# Patient Record
Sex: Female | Born: 1985 | Race: Black or African American | Hispanic: No | Smoking: Current every day smoker
Health system: Southern US, Community
[De-identification: ages and names within clinical notes are randomized; demographics above are authoritative.]

## PROBLEM LIST (undated history)

## (undated) DIAGNOSIS — J45909 Unspecified asthma, uncomplicated: Secondary | ICD-10-CM

## (undated) DIAGNOSIS — I2699 Other pulmonary embolism without acute cor pulmonale: Secondary | ICD-10-CM

## (undated) HISTORY — PX: TUBAL LIGATION: SHX77

---

## 2008-02-14 ENCOUNTER — Emergency Department: Payer: Self-pay | Admitting: Emergency Medicine

## 2008-04-11 ENCOUNTER — Emergency Department: Payer: Self-pay | Admitting: Emergency Medicine

## 2008-08-10 ENCOUNTER — Emergency Department: Payer: Self-pay | Admitting: Emergency Medicine

## 2008-10-27 ENCOUNTER — Emergency Department: Payer: Self-pay | Admitting: Emergency Medicine

## 2009-01-01 ENCOUNTER — Emergency Department: Payer: Self-pay | Admitting: Emergency Medicine

## 2009-01-08 ENCOUNTER — Emergency Department: Payer: Self-pay | Admitting: Emergency Medicine

## 2009-04-12 ENCOUNTER — Emergency Department: Payer: Self-pay | Admitting: Emergency Medicine

## 2009-12-31 ENCOUNTER — Emergency Department: Payer: Self-pay | Admitting: Emergency Medicine

## 2010-01-27 ENCOUNTER — Emergency Department: Payer: Self-pay | Admitting: Emergency Medicine

## 2010-02-15 ENCOUNTER — Emergency Department: Payer: Self-pay | Admitting: Internal Medicine

## 2010-03-12 ENCOUNTER — Emergency Department: Payer: Self-pay | Admitting: Emergency Medicine

## 2010-03-13 ENCOUNTER — Emergency Department: Payer: Self-pay | Admitting: Emergency Medicine

## 2010-04-21 ENCOUNTER — Emergency Department: Payer: Self-pay | Admitting: Emergency Medicine

## 2010-06-22 ENCOUNTER — Emergency Department: Payer: Self-pay | Admitting: Unknown Physician Specialty

## 2010-08-17 ENCOUNTER — Emergency Department: Payer: Self-pay | Admitting: Emergency Medicine

## 2011-12-14 ENCOUNTER — Emergency Department: Payer: Self-pay | Admitting: Emergency Medicine

## 2011-12-16 ENCOUNTER — Emergency Department: Payer: Self-pay | Admitting: Emergency Medicine

## 2012-01-23 ENCOUNTER — Emergency Department: Payer: Self-pay | Admitting: Emergency Medicine

## 2012-11-29 ENCOUNTER — Emergency Department: Payer: Self-pay | Admitting: Emergency Medicine

## 2012-11-29 LAB — URINALYSIS, COMPLETE
Glucose,UR: NEGATIVE mg/dL (ref 0–75)
Ketone: NEGATIVE
Nitrite: NEGATIVE
Ph: 7 (ref 4.5–8.0)
Protein: NEGATIVE
RBC,UR: 330 /HPF (ref 0–5)
Squamous Epithelial: 2
Transitional Epi: <1
WBC UR: 72 /HPF (ref 0–5)

## 2013-06-04 ENCOUNTER — Emergency Department: Payer: Self-pay | Admitting: Internal Medicine

## 2014-03-26 ENCOUNTER — Emergency Department: Payer: Self-pay | Admitting: Emergency Medicine

## 2014-04-20 ENCOUNTER — Emergency Department: Payer: Self-pay | Admitting: Emergency Medicine

## 2014-05-31 ENCOUNTER — Emergency Department: Payer: Self-pay | Admitting: Emergency Medicine

## 2014-07-06 ENCOUNTER — Emergency Department: Payer: Self-pay | Admitting: Internal Medicine

## 2014-07-08 ENCOUNTER — Emergency Department: Payer: Self-pay | Admitting: Emergency Medicine

## 2014-07-08 LAB — URINALYSIS, COMPLETE
BACTERIA: NONE SEEN
Blood: NEGATIVE
Glucose,UR: NEGATIVE mg/dL (ref 0–75)
Hyaline Cast: 3
KETONE: NEGATIVE
LEUKOCYTE ESTERASE: NEGATIVE
Nitrite: NEGATIVE
Ph: 7 (ref 4.5–8.0)
Protein: NEGATIVE
Specific Gravity: 1.015 (ref 1.003–1.030)
Squamous Epithelial: 2

## 2014-07-08 LAB — BASIC METABOLIC PANEL
Anion Gap: 8 (ref 7–16)
BUN: 8 mg/dL (ref 7–18)
CHLORIDE: 110 mmol/L — AB (ref 98–107)
CREATININE: 0.97 mg/dL (ref 0.60–1.30)
Calcium, Total: 8.5 mg/dL (ref 8.5–10.1)
Co2: 24 mmol/L (ref 21–32)
EGFR (African American): >60
GLUCOSE: 104 mg/dL — AB (ref 65–99)
OSMOLALITY: 282 (ref 275–301)
Potassium: 3.5 mmol/L (ref 3.5–5.1)
Sodium: 142 mmol/L (ref 136–145)

## 2014-07-08 LAB — CBC
HCT: 43.9 % (ref 35.0–47.0)
HGB: 14.6 g/dL (ref 12.0–16.0)
MCH: 32.9 pg (ref 26.0–34.0)
MCHC: 33.3 g/dL (ref 32.0–36.0)
MCV: 99 fL (ref 80–100)
Platelet: 127 10*3/uL — ABNORMAL LOW (ref 150–440)
RBC: 4.44 10*6/uL (ref 3.80–5.20)
RDW: 13.9 % (ref 11.5–14.5)
WBC: 9.5 10*3/uL (ref 3.6–11.0)

## 2014-07-15 LAB — BODY FLUID CULTURE

## 2015-02-24 ENCOUNTER — Emergency Department: Admit: 2015-02-24 | Disposition: A | Discharge status: Home / Self Care | Payer: Self-pay | Admitting: Internal Medicine

## 2015-05-08 ENCOUNTER — Emergency Department
Admission: EM | Admit: 2015-05-08 | Discharge: 2015-05-08 | Disposition: A | Discharge status: Home / Self Care | Payer: Managed Care, Other (non HMO) | Attending: Emergency Medicine | Admitting: Emergency Medicine

## 2015-05-08 ENCOUNTER — Encounter: Payer: Self-pay | Admitting: Emergency Medicine

## 2015-05-08 ENCOUNTER — Emergency Department: Payer: Managed Care, Other (non HMO)

## 2015-05-08 DIAGNOSIS — S5012XA Contusion of left forearm, initial encounter: Secondary | ICD-10-CM | POA: Diagnosis not present

## 2015-05-08 DIAGNOSIS — Y9289 Other specified places as the place of occurrence of the external cause: Secondary | ICD-10-CM | POA: Insufficient documentation

## 2015-05-08 DIAGNOSIS — S4992XA Unspecified injury of left shoulder and upper arm, initial encounter: Secondary | ICD-10-CM | POA: Diagnosis present

## 2015-05-08 DIAGNOSIS — Y9301 Activity, walking, marching and hiking: Secondary | ICD-10-CM | POA: Insufficient documentation

## 2015-05-08 DIAGNOSIS — Z720 Tobacco use: Secondary | ICD-10-CM | POA: Insufficient documentation

## 2015-05-08 DIAGNOSIS — Y998 Other external cause status: Secondary | ICD-10-CM | POA: Diagnosis not present
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
## 2015-05-08 NOTE — Discharge Instructions (Signed)
Apply ice. Take over-the-counter Tylenol or ibuprofen as needed for pain.  Follow-up with orthopedic as needed for continued pain.  Return to the ER for new or worsening concerns.  Contusion A contusion is a deep bruise. Contusions happen when an injury causes bleeding under the skin. Signs of bruising include pain, puffiness (swelling), and discolored skin. The contusion may turn blue, purple, or yellow. HOME CARE   Put ice on the injured area.  Put ice in a plastic bag.  Place a towel between your skin and the bag.  Leave the ice on for 15-20 minutes, 03-04 times a day.  Only take medicine as told by your doctor.  Rest the injured area.  If possible, raise (elevate) the injured area to lessen puffiness. GET HELP RIGHT AWAY IF:   You have more bruising or puffiness.  You have pain that is getting worse.  Your puffiness or pain is not helped by medicine. MAKE SURE YOU:   Understand these instructions.  Will watch your condition.  Will get help right away if you are not doing well or get worse. Document Released: 04/13/2008 Document Revised: 01/18/2012 Document Reviewed: 08/31/2011 [facility] Patient Information 2015 [employer] [place]. This information is not intended to replace advice given to you by your health care provider. Make sure you discuss any questions you have with your health care provider.

## 2015-05-09 ENCOUNTER — Emergency Department: Payer: Managed Care, Other (non HMO)

## 2015-05-09 ENCOUNTER — Emergency Department
Admission: EM | Admit: 2015-05-09 | Discharge: 2015-05-09 | Disposition: A | Discharge status: Home / Self Care | Payer: Managed Care, Other (non HMO) | Attending: Emergency Medicine | Admitting: Emergency Medicine

## 2015-05-09 DIAGNOSIS — Y9301 Activity, walking, marching and hiking: Secondary | ICD-10-CM | POA: Diagnosis not present

## 2015-05-09 DIAGNOSIS — Y92410 Unspecified street and highway as the place of occurrence of the external cause: Secondary | ICD-10-CM | POA: Diagnosis not present

## 2015-05-09 DIAGNOSIS — Z720 Tobacco use: Secondary | ICD-10-CM | POA: Diagnosis not present

## 2015-05-09 DIAGNOSIS — S46912A Strain of unspecified muscle, fascia and tendon at shoulder and upper arm level, left arm, initial encounter: Secondary | ICD-10-CM | POA: Insufficient documentation

## 2015-05-09 DIAGNOSIS — S4992XA Unspecified injury of left shoulder and upper arm, initial encounter: Secondary | ICD-10-CM | POA: Diagnosis present

## 2015-05-09 DIAGNOSIS — Y998 Other external cause status: Secondary | ICD-10-CM | POA: Diagnosis not present

## 2015-05-09 HISTORY — DX: Unspecified asthma, uncomplicated: J45.909

## 2015-05-09 MED ORDER — NAPROXEN 500 MG PO TABS: 500.0000 mg | ORAL_TABLET | Freq: Two times a day (BID) | ORAL | Status: DC
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
## 2015-05-09 NOTE — ED Notes (Signed)
Pt informed to return if any life threatening symptoms occur.  
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
## 2015-08-13 ENCOUNTER — Emergency Department
Admission: EM | Admit: 2015-08-13 | Discharge: 2015-08-13 | Disposition: A | Discharge status: Home / Self Care | Payer: Managed Care, Other (non HMO) | Attending: Emergency Medicine | Admitting: Emergency Medicine

## 2015-08-13 ENCOUNTER — Ambulatory Visit (HOSPITAL_COMMUNITY)
Admission: EM | Admit: 2015-08-13 | Discharge: 2015-08-13 | Disposition: A | Discharge status: Home / Self Care | Payer: No Typology Code available for payment source | Source: Ambulatory Visit | Attending: Emergency Medicine | Admitting: Emergency Medicine

## 2015-08-13 DIAGNOSIS — Y9389 Activity, other specified: Secondary | ICD-10-CM | POA: Diagnosis not present

## 2015-08-13 DIAGNOSIS — F419 Anxiety disorder, unspecified: Secondary | ICD-10-CM | POA: Insufficient documentation

## 2015-08-13 DIAGNOSIS — T7421XA Adult sexual abuse, confirmed, initial encounter: Secondary | ICD-10-CM | POA: Insufficient documentation

## 2015-08-13 DIAGNOSIS — Z720 Tobacco use: Secondary | ICD-10-CM | POA: Diagnosis not present

## 2015-08-13 DIAGNOSIS — B9689 Other specified bacterial agents as the cause of diseases classified elsewhere: Secondary | ICD-10-CM

## 2015-08-13 DIAGNOSIS — Z791 Long term (current) use of non-steroidal anti-inflammatories (NSAID): Secondary | ICD-10-CM | POA: Diagnosis not present

## 2015-08-13 DIAGNOSIS — Z0441 Encounter for examination and observation following alleged adult rape: Secondary | ICD-10-CM | POA: Insufficient documentation

## 2015-08-13 DIAGNOSIS — Y9289 Other specified places as the place of occurrence of the external cause: Secondary | ICD-10-CM | POA: Insufficient documentation

## 2015-08-13 DIAGNOSIS — N760 Acute vaginitis: Secondary | ICD-10-CM | POA: Diagnosis not present

## 2015-08-13 DIAGNOSIS — Z3202 Encounter for pregnancy test, result negative: Secondary | ICD-10-CM | POA: Insufficient documentation

## 2015-08-13 DIAGNOSIS — Y998 Other external cause status: Secondary | ICD-10-CM | POA: Insufficient documentation

## 2015-08-13 LAB — CBC
HCT: 46.9 % (ref 35.0–47.0)
HEMOGLOBIN: 15.9 g/dL (ref 12.0–16.0)
MCH: 33 pg (ref 26.0–34.0)
MCHC: 33.9 g/dL (ref 32.0–36.0)
MCV: 97.1 fL (ref 80.0–100.0)
Platelets: 132 10*3/uL — ABNORMAL LOW (ref 150–440)
RBC: 4.82 MIL/uL (ref 3.80–5.20)
RDW: 14.1 % (ref 11.5–14.5)
WBC: 10.2 10*3/uL (ref 3.6–11.0)

## 2015-08-13 LAB — COMPREHENSIVE METABOLIC PANEL
ALK PHOS: 86 U/L (ref 38–126)
ALT: 23 U/L (ref 14–54)
AST: 26 U/L (ref 15–41)
Albumin: 4.1 g/dL (ref 3.5–5.0)
Anion gap: 10 (ref 5–15)
BUN: 11 mg/dL (ref 6–20)
CALCIUM: 9.5 mg/dL (ref 8.9–10.3)
CO2: 24 mmol/L (ref 22–32)
CREATININE: 0.85 mg/dL (ref 0.44–1.00)
Chloride: 108 mmol/L (ref 101–111)
GFR calc non Af Amer: >60 mL/min (ref >60)
GLUCOSE: 129 mg/dL — AB (ref 65–99)
Potassium: 4.2 mmol/L (ref 3.5–5.1)
SODIUM: 142 mmol/L (ref 135–145)
Total Bilirubin: 0.7 mg/dL (ref 0.3–1.2)
Total Protein: 7.4 g/dL (ref 6.5–8.1)

## 2015-08-13 LAB — POCT PREGNANCY, URINE: Preg Test, Ur: NEGATIVE

## 2015-08-13 LAB — RAPID HIV SCREEN (HIV 1/2 AB+AG)
HIV 1/2 Antibodies: NONREACTIVE
HIV-1 P24 Antigen - HIV24: NONREACTIVE

## 2015-08-13 MED ORDER — PROMETHAZINE HCL 25 MG PO TABS
25.0000 mg | ORAL_TABLET | Freq: Four times a day (QID) | ORAL | Status: DC | PRN
  Administered 2015-08-13: 12.5 mg via ORAL

## 2015-08-13 MED ORDER — AZITHROMYCIN 250 MG PO TABS
ORAL_TABLET | ORAL | Status: AC
  Administered 2015-08-13: 1000 mg via ORAL
  Filled 2015-08-13: qty 4

## 2015-08-13 MED ORDER — ULIPRISTAL ACETATE 30 MG PO TABS
ORAL_TABLET | ORAL | Status: AC
  Filled 2015-08-13: qty 1

## 2015-08-13 MED ORDER — METRONIDAZOLE 500 MG PO TABS
2000.0000 mg | ORAL_TABLET | Freq: Once | ORAL | Status: AC
  Administered 2015-08-13: 2000 mg via ORAL

## 2015-08-13 MED ORDER — ALIGN PO CAPS: 1.0000 | ORAL_CAPSULE | Freq: Every day | ORAL | Status: DC

## 2015-08-13 MED ORDER — DIVALPROEX SODIUM 500 MG PO DR TAB
DELAYED_RELEASE_TABLET | ORAL | Status: AC
  Administered 2015-08-13: 1000 mg via ORAL
  Filled 2015-08-13: qty 2

## 2015-08-13 MED ORDER — AZITHROMYCIN 1 G PO PACK: 1.0000 g | PACK | Freq: Once | ORAL | Status: DC

## 2015-08-13 MED ORDER — CEFIXIME 400 MG PO TABS
400.0000 mg | ORAL_TABLET | Freq: Once | ORAL | Status: AC
  Administered 2015-08-13: 400 mg via ORAL

## 2015-08-13 MED ORDER — DIVALPROEX SODIUM 500 MG PO DR TAB
1000.0000 mg | DELAYED_RELEASE_TABLET | Freq: Once | ORAL | Status: AC
  Administered 2015-08-13: 1000 mg via ORAL

## 2015-08-13 MED ORDER — FLUCONAZOLE 150 MG PO TABS: 150.0000 mg | ORAL_TABLET | Freq: Every day | ORAL | Status: DC

## 2015-08-13 MED ORDER — ULIPRISTAL ACETATE 30 MG PO TABS: 30.0000 mg | ORAL_TABLET | Freq: Once | ORAL | Status: DC

## 2015-08-13 MED ORDER — EMTRICITABINE-TENOFOVIR DF 200-300 MG PO TABS: 1.0000 | ORAL_TABLET | Freq: Every day | ORAL | Status: DC

## 2015-08-13 MED ORDER — RALTEGRAVIR POTASSIUM 400 MG PO TABS: 400.0000 mg | ORAL_TABLET | Freq: Two times a day (BID) | ORAL | Status: DC

## 2015-08-13 MED ORDER — CEFIXIME 400 MG PO CAPS
ORAL_CAPSULE | ORAL | Status: AC
  Administered 2015-08-13: 400 mg via ORAL
  Filled 2015-08-13: qty 1

## 2015-08-13 NOTE — ED Notes (Signed)
SANE rn states that discharge instructions were given to pt and pt verbalized understanding.

## 2015-08-13 NOTE — Care Management Note (Signed)
Case Management Note  Patient Details  Name: Flor Whitacre MRN: [id number] Date of Birth: 20-Aug-1986  Subjective/Objective:         Updated by pharmacy, and the remaining medication will be here tomorrow by 12noon. I have asked the pt. To return tomorrow afternoon to pick it up, and she says she understands, Discharge planning done by SANE staff.          Action/Plan:   Expected Discharge Date:                  Expected Discharge Plan:     In-House Referral:     Discharge planning Services     Post Acute Care Choice:    Choice offered to:     DME Arranged:    DME Agency:     HH Arranged:    HH Agency:     Status of Service:     Medicare Important Message Given:    Date Medicare IM Given:    Medicare IM give by:    Date Additional Medicare IM Given:    Additional Medicare Important Message give by:     If discussed at Long Length of Stay Meetings, dates discussed:    Additional Comments:  Berna Bue, RN 08/13/2015, 12:33 PM

## 2015-08-13 NOTE — ED Notes (Signed)
SANE nurse in with patient.
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
## 2015-08-13 NOTE — ED Notes (Signed)
Pt sitting in room, denies complaints at this time.
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
## 2015-08-13 NOTE — ED Notes (Signed)
Meds from pyxis not available in SANE pkt, meds pulled individually. Given to SANE RN.
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
## 2015-08-13 NOTE — Care Management Note (Signed)
Case Management Note  Patient Details  Name: Joy Ramos MRN: [id number] Date of Birth: [birth year]  Subjective/Objective:   Spoke to pharmacy ,and they now say they only have 30days  Of one medication and 7 days of the other. They will issue the meds they have in stock, and the pt. Will need to return to get the balance in 2 days time. This information has been shared with Weber Cooks the SANE  Nurse, as well as Wylie Hail, the RN for the pt.                 Action/Plan:   Expected Discharge Date:                  Expected Discharge Plan:     In-House Referral:     Discharge planning Services     Post Acute Care Choice:    Choice offered to:     DME Arranged:    DME Agency:     HH Arranged:    HH Agency:     Status of Service:     Medicare Important Message Given:    Date Medicare IM Given:    Medicare IM give by:    Date Additional Medicare IM Given:    Additional Medicare Important Message give by:     If discussed at Long Length of Stay Meetings, dates discussed:    Additional Comments:  Berna Bue, RN 08/13/2015, 12:22 PM

## 2015-08-13 NOTE — Care Management Note (Signed)
Case Management Note  Patient Details  Name: Joy Ramos MRN: [id number] Date of Birth: [birth year]  Subjective/Objective:    Meds requested from pharmacy per policy for HIV PEP. Verified quantity needed is available. Medical necessity letter completed by Dr Cyril Loosen and sent with scripts to pharmacy.                Action/Plan:   Expected Discharge Date:                  Expected Discharge Plan:     In-House Referral:     Discharge planning Services     Post Acute Care Choice:    Choice offered to:     DME Arranged:    DME Agency:     HH Arranged:    HH Agency:     Status of Service:     Medicare Important Message Given:    Date Medicare IM Given:    Medicare IM give by:    Date Additional Medicare IM Given:    Additional Medicare Important Message give by:     If discussed at Long Length of Stay Meetings, dates discussed:    Additional Comments:  Berna Bue, RN 08/13/2015, 10:18 AM

## 2015-08-14 DIAGNOSIS — Z0441 Encounter for examination and observation following alleged adult rape: Secondary | ICD-10-CM | POA: Diagnosis not present

## 2015-08-14 NOTE — Care Management Note (Signed)
Case Management Note  Patient Details  Name: Joy Ramos MRN: [id number] Date of Birth: 09-Oct-1986  Subjective/Objective:  Called patient at number provided to let her know the remaining medication has arrived and is ready for pick up. She has stated she will be here soon to do same.                  Action/Plan:   Expected Discharge Date:                  Expected Discharge Plan:     In-House Referral:     Discharge planning Services     Post Acute Care Choice:    Choice offered to:     DME Arranged:    DME Agency:     HH Arranged:    HH Agency:     Status of Service:     Medicare Important Message Given:    Date Medicare IM Given:    Medicare IM give by:    Date Additional Medicare IM Given:    Additional Medicare Important Message give by:     If discussed at Long Length of Stay Meetings, dates discussed:    Additional Comments:  Berna Bue, RN 08/14/2015, 10:52 AM

## 2016-01-04 ENCOUNTER — Encounter: Payer: Self-pay | Admitting: Emergency Medicine

## 2016-01-04 ENCOUNTER — Emergency Department
Admission: EM | Admit: 2016-01-04 | Discharge: 2016-01-04 | Disposition: A | Discharge status: Home / Self Care | Payer: Managed Care, Other (non HMO) | Attending: Student | Admitting: Student

## 2016-01-04 DIAGNOSIS — Z79899 Other long term (current) drug therapy: Secondary | ICD-10-CM | POA: Insufficient documentation

## 2016-01-04 DIAGNOSIS — K644 Residual hemorrhoidal skin tags: Secondary | ICD-10-CM | POA: Diagnosis not present

## 2016-01-04 DIAGNOSIS — F17210 Nicotine dependence, cigarettes, uncomplicated: Secondary | ICD-10-CM | POA: Diagnosis not present

## 2016-01-04 DIAGNOSIS — Z792 Long term (current) use of antibiotics: Secondary | ICD-10-CM | POA: Diagnosis not present

## 2016-01-04 DIAGNOSIS — Z791 Long term (current) use of non-steroidal anti-inflammatories (NSAID): Secondary | ICD-10-CM | POA: Diagnosis not present

## 2016-01-04 DIAGNOSIS — K649 Unspecified hemorrhoids: Secondary | ICD-10-CM | POA: Diagnosis present

## 2016-01-04 MED ORDER — HYDROCODONE-ACETAMINOPHEN 5-325 MG PO TABS: 1.0000 | ORAL_TABLET | ORAL | Status: DC | PRN

## 2016-01-04 MED ORDER — HYDROCORTISONE 2.5 % RE CREA: TOPICAL_CREAM | RECTAL | Status: AC

## 2016-01-04 MED ORDER — ZINC OXIDE 16 % EX OINT: 1.0000 "application " | TOPICAL_OINTMENT | CUTANEOUS | Status: DC | PRN

## 2016-01-04 NOTE — ED Provider Notes (Signed)
[facility] Emergency Department Provider Note  ____________________________________________  Time seen: Approximately 7:46 AM  I have reviewed the triage vital signs and the nursing notes.   HISTORY  Chief Complaint Hemorrhoids    HPI Joy Ramos is a 30 y.o. female who presents for evaluation of hemorrhoids. Patient has a past medical history significant for the same and desires referral to a surgeon for evaluation and removal. Patient states that she has tried suppositories and cream over-the-counter and no relief. Denies any bleeding. Denies any recent trauma to the rectal area. When her stools or numbness heart seems to make her symptoms will get better so she started taking some stool softeners. Worsened with straining. Describes pain as moderately 5/10 at this time. Has not noticed any blood when wiping   Past Medical History  Diagnosis Date  . Asthma     There are no active problems to display for this patient.   Past Surgical History  Procedure Laterality Date  . Cesarean section      x3  . Tubal ligation      Current Outpatient Rx  Name  Route  Sig  Dispense  Refill  . bifidobacterium infantis (ALIGN) capsule   Oral   Take 1 capsule by mouth daily.   90 capsule   0   . emtricitabine-tenofovir (TRUVADA) 200-300 MG tablet   Oral   Take 1 tablet by mouth daily.   30 tablet   0   . fluconazole (DIFLUCAN) 150 MG tablet   Oral   Take 1 tablet (150 mg total) by mouth daily.   2 tablet   0     Take 1 tab today and then the next in 72 hours   . HYDROcodone-acetaminophen (NORCO) 5-325 MG tablet   Oral   Take 1-2 tablets by mouth every 4 (four) hours as needed for moderate pain.   15 tablet   0   . hydrocortisone (ANUSOL-HC) 2.5 % rectal cream      Apply rectally 2 times daily   30 g   1   . naproxen (NAPROSYN) 500 MG tablet   Oral   Take 1 tablet (500 mg total) by mouth 2 (two) times daily with a meal.   30 tablet   0   . raltegravir (ISENTRESS) 400 MG tablet   Oral   Take 1 tablet (400 mg total) by mouth 2 (two) times daily.   60 tablet   0   . Zinc Oxide 16 % OINT   Apply externally   Apply 1 application topically as needed.   1 Tube   2     Allergies Morphine and related  No family history on file.  Social History Social History  Substance Use Topics  . Smoking status: Current Every Day Smoker -- 1.50 packs/day    Types: Cigarettes  . Smokeless tobacco: None  . Alcohol Use: No    Review of Systems Constitutional: No fever/chills Cardiovascular: Denies chest pain. Respiratory: Denies shortness of breath. Gastrointestinal: No abdominal pain.  No nausea, no vomiting.  No diarrhea.  No constipation. Positive for hemorrhoids Genitourinary: Negative for dysuria. Musculoskeletal: Negative for back pain. Skin: Negative for rash. Neurological: Negative for headaches, focal weakness or numbness.  10-point ROS otherwise negative.  ____________________________________________   PHYSICAL EXAM:  VITAL SIGNS: ED Triage Vitals  Enc Vitals Group     BP 01/04/16 0723 108/66 mmHg     Pulse Rate 01/04/16 0723 103     Resp 01/04/16  0723 18     Temp 01/04/16 0723 98.4 F (36.9 C)     Temp Source 01/04/16 0723 Oral     SpO2 01/04/16 0723 100 %     Weight 01/04/16 0723 199 lb (90.266 kg)     Height 01/04/16 0723  (1.651 m)     Head Cir --      Peak Flow --      Pain Score 01/04/16 0724 5     Pain Loc --      Pain Edu? --      Excl. in GC? --     Constitutional: Alert and oriented. Well appearing and in no acute distress.  Cardiovascular: Normal rate, regular rhythm. Grossly normal heart sounds.  Good peripheral circulation. Respiratory: Normal respiratory effort.  No retractions. Lungs CTAB. Gastrointestinal: Soft and nontender. No distention. No abdominal bruits. No CVA tenderness. Rectal exam was unremarkable with a small few nonthrombosed hemorrhoids. Musculoskeletal: No  lower extremity tenderness nor edema.  No joint effusions. Neurologic:  Normal speech and language. No gross focal neurologic deficits are appreciated. No gait instability. Skin:  Skin is warm, dry and intact. No rash noted. Psychiatric: Mood and affect are normal. Speech and behavior are normal.  ____________________________________________   LABS (all labs ordered are listed, but only abnormal results are displayed)  Labs Reviewed - No data to display ____________________________________________    PROCEDURES  Procedure(s) performed: None  Critical Care performed: No  ____________________________________________   INITIAL IMPRESSION / ASSESSMENT AND PLAN / ED COURSE  Pertinent labs & imaging results that were available during my care of the patient were reviewed by me and considered in my medical decision making (see chart for details).  Mild case of external, nonthrombosed hemorrhoids. Reassurance provided Rx given for Anusol HC cream and zinc oxide ointment. Patient encouraged to follow up with the surgeon on-call number given. Patient voices understanding we'll continue her stool softeners and follow up with her PCP as scheduled. She voices no other emergency medical complaints at this time. ____________________________________________   FINAL CLINICAL IMPRESSION(S) / ED DIAGNOSES  Final diagnoses:  External hemorrhoids without complication     This chart was dictated using voice recognition software/Dragon. Despite best efforts to proofread, errors can occur which can change the meaning. Any change was purely unintentional.   Evangeline Dakin, PA-C 01/04/16 0800  Gayla Doss, MD 01/04/16 1400

## 2016-01-04 NOTE — Discharge Instructions (Signed)
Hemorrhoids °Hemorrhoids are swollen veins around the rectum or anus. There are two types of hemorrhoids:  °· Internal hemorrhoids. These occur in the veins just inside the rectum. They may poke through to the outside and become irritated and painful. °· External hemorrhoids. These occur in the veins outside the anus and can be felt as a painful swelling or hard lump near the anus. °CAUSES °· Pregnancy.   °· Obesity.   °· Constipation or diarrhea.   °· Straining to have a bowel movement.   °· Sitting for long periods on the toilet. °· Heavy lifting or other activity that caused you to strain. °· Anal intercourse. °SYMPTOMS  °· Pain.   °· Anal itching or irritation.   °· Rectal bleeding.   °· Fecal leakage.   °· Anal swelling.   °· One or more lumps around the anus.   °DIAGNOSIS  °Your caregiver may be able to diagnose hemorrhoids by visual examination. Other examinations or tests that may be performed include:  °· Examination of the rectal area with a gloved hand (digital rectal exam).   °· Examination of anal canal using a small tube (scope).   °· A blood test if you have lost a significant amount of blood. °· A test to look inside the colon (sigmoidoscopy or colonoscopy). °TREATMENT °Most hemorrhoids can be treated at home. However, if symptoms do not seem to be getting better or if you have a lot of rectal bleeding, your caregiver may perform a procedure to help make the hemorrhoids get smaller or remove them completely. Possible treatments include:  °· Placing a rubber band at the base of the hemorrhoid to cut off the circulation (rubber band ligation).   °· Injecting a chemical to shrink the hemorrhoid (sclerotherapy).   °· Using a tool to burn the hemorrhoid (infrared light therapy).   °· Surgically removing the hemorrhoid (hemorrhoidectomy).   °· Stapling the hemorrhoid to block blood flow to the tissue (hemorrhoid stapling).   °HOME CARE INSTRUCTIONS  °· Eat foods with fiber, such as whole grains, beans,  nuts, fruits, and vegetables. Ask your doctor about taking products with added fiber in them (fiber supplements). °· Increase fluid intake. Drink enough water and fluids to keep your urine clear or pale yellow.   °· Exercise regularly.   °· Go to the bathroom when you have the urge to have a bowel movement. Do not wait.   °· Avoid straining to have bowel movements.   °· Keep the anal area dry and clean. Use wet toilet paper or moist towelettes after a bowel movement.   °· Medicated creams and suppositories may be used or applied as directed.   °· Only take over-the-counter or prescription medicines as directed by your caregiver.   °· Take warm sitz baths for 15-20 minutes, 3-4 times a day to ease pain and discomfort.   °· Place ice packs on the hemorrhoids if they are tender and swollen. Using ice packs between sitz baths may be helpful.   °· Put ice in a plastic bag.   °· Place a towel between your skin and the bag.   °· Leave the ice on for 15-20 minutes, 3-4 times a day.   °· Do not use a donut-shaped pillow or sit on the toilet for long periods. This increases blood pooling and pain.   °SEEK MEDICAL CARE IF: °· You have increasing pain and swelling that is not controlled by treatment or medicine. °· You have uncontrolled bleeding. °· You have difficulty or you are unable to have a bowel movement. °· You have pain or inflammation outside the area of the hemorrhoids. °MAKE SURE YOU: °· Understand these instructions. °·   Will watch your condition.  Will get help right away if you are not doing well or get worse.   This information is not intended to replace advice given to you by your health care provider. Make sure you discuss any questions you have with your health care provider.   Document Released: 10/23/2000 Document Revised: 10/12/2012 Document Reviewed: 08/30/2012 Elsevier Interactive Patient Education 2016 [employer] Inc[employer]  Nonsurgical Procedures for Hemorrhoids Nonsurgical procedures can be used to  treat hemorrhoids. Hemorrhoids are swollen veins that are inside the rectum (internal hemorrhoids) or around the anus (external hemorrhoids). They are caused by increased pressure in the anal area. This pressure may result from straining to have a bowel movement (constipation), diarrhea, pregnancy, obesity, anal sex, or sitting for long periods of time. Hemorrhoids can cause symptoms such as pain and bleeding. Various procedures may be performed if diet changes, lifestyle changes, and other treatments do not help your symptoms. Some of these procedures do not involve surgery. Three common nonsurgical procedures are:  Rubber band ligation. Rubber bands are used to cut off the blood supply to the hemorrhoids.  Sclerotherapy. Medicine is injected into the hemorrhoids to shrink them.  Infrared coagulation. A type of light energy is used to get rid of the hemorrhoids. LET [facility] LLCYOUR HEALTH CARE PROVIDER KNOW ABOUT:  Any allergies you have.  All medicines you are taking, including vitamins, herbs, eye drops, creams, and over-the-counter medicines.  Previous problems you or members of your family have had with the use of anesthetics.  Any blood disorders you have.  Previous surgeries you have had.  Any medical conditions you have.  Whether you are pregnant or may be pregnant. RISKS AND COMPLICATIONS Generally, this is a safe procedure. However, problems may occur, including:  Infection.  Bleeding.  Pain. BEFORE THE PROCEDURE  Ask your health care provider about:  Changing or stopping your regular medicines. This is especially important if you are taking diabetes medicines or blood thinners.  Taking medicines such as aspirin and ibuprofen. These medicines can thin your blood. Do not take these medicines before your procedure if your health care provider instructs you not to.  You may need to have a procedure to examine the inside of your colon with a scope (colonoscopy). Your health care  provider may do this to make sure that there are no other causes for your bleeding or pain. PROCEDURE  Your health care provider will clean your rectal area with a rinsing solution.  A lubricating jelly may be placed into your rectum. The jelly may contain a medicine to numb the area (local anesthetic).  Your health care provider will insert a short scope (anoscope) into your rectum to examine the hemorrhoids.  One of the following techniques will be used. Rubber Band Ligation Your health care provider will place medical instruments through the scope to put rubber bands around the base of your hemorrhoids. The bands will cut off the blood supply to the hemorrhoids. The hemorrhoids will fall off after several days. Sclerotherapy Your health care provider will inject medicine through the scope into your hemorrhoids. This will cause them to shrink and dry up. Infrared Coagulation Your health care provider will shine a type of light through the scope onto your hemorrhoids. This light will generate energy (infrared radiation). It will cause the hemorrhoids to scar and then fall off. Each of these procedures may vary among health care providers and hospitals. AFTER THE PROCEDURE  You will be monitored to make sure that you  have no bleeding.  Return to your normal activities as told by your health care provider.   This information is not intended to replace advice given to you by your health care provider. Make sure you discuss any questions you have with your health care provider.   Document Released: 08/23/2009 Document Revised: 07/17/2015 Document Reviewed: 01/21/2015 Elsevier Interactive Patient Education [employer]2016 Else[employer] Inc.

## 2016-01-14 ENCOUNTER — Encounter: Payer: Self-pay | Admitting: Medical Oncology

## 2016-01-14 ENCOUNTER — Emergency Department
Admission: EM | Admit: 2016-01-14 | Discharge: 2016-01-14 | Disposition: A | Discharge status: Home / Self Care | Payer: Managed Care, Other (non HMO) | Attending: Emergency Medicine | Admitting: Emergency Medicine

## 2016-01-14 ENCOUNTER — Emergency Department: Payer: Managed Care, Other (non HMO)

## 2016-01-14 DIAGNOSIS — Y9389 Activity, other specified: Secondary | ICD-10-CM | POA: Insufficient documentation

## 2016-01-14 DIAGNOSIS — Y998 Other external cause status: Secondary | ICD-10-CM | POA: Diagnosis not present

## 2016-01-14 DIAGNOSIS — S29002A Unspecified injury of muscle and tendon of back wall of thorax, initial encounter: Secondary | ICD-10-CM | POA: Insufficient documentation

## 2016-01-14 DIAGNOSIS — Y92410 Unspecified street and highway as the place of occurrence of the external cause: Secondary | ICD-10-CM | POA: Insufficient documentation

## 2016-01-14 DIAGNOSIS — Z792 Long term (current) use of antibiotics: Secondary | ICD-10-CM | POA: Diagnosis not present

## 2016-01-14 DIAGNOSIS — Z79899 Other long term (current) drug therapy: Secondary | ICD-10-CM | POA: Diagnosis not present

## 2016-01-14 DIAGNOSIS — F17210 Nicotine dependence, cigarettes, uncomplicated: Secondary | ICD-10-CM | POA: Insufficient documentation

## 2016-01-14 DIAGNOSIS — S199XXA Unspecified injury of neck, initial encounter: Secondary | ICD-10-CM | POA: Diagnosis present

## 2016-01-14 DIAGNOSIS — Z791 Long term (current) use of non-steroidal anti-inflammatories (NSAID): Secondary | ICD-10-CM | POA: Insufficient documentation

## 2016-01-14 DIAGNOSIS — M62838 Other muscle spasm: Secondary | ICD-10-CM | POA: Insufficient documentation

## 2016-01-14 DIAGNOSIS — Z7952 Long term (current) use of systemic steroids: Secondary | ICD-10-CM | POA: Diagnosis not present

## 2016-01-14 DIAGNOSIS — S161XXA Strain of muscle, fascia and tendon at neck level, initial encounter: Secondary | ICD-10-CM | POA: Diagnosis not present

## 2016-01-14 MED ORDER — NAPROXEN 500 MG PO TABS: 500.0000 mg | ORAL_TABLET | Freq: Two times a day (BID) | ORAL | Status: DC

## 2016-01-14 MED ORDER — CYCLOBENZAPRINE HCL 10 MG PO TABS: 10.0000 mg | ORAL_TABLET | Freq: Three times a day (TID) | ORAL | Status: DC | PRN
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
## 2016-01-14 NOTE — Discharge Instructions (Signed)
Cervical Strain and Sprain With Rehab Cervical strain and sprain are injuries that commonly occur with "whiplash" injuries. Whiplash occurs when the neck is forcefully whipped backward or forward, such as during a motor vehicle accident or during contact sports. The muscles, ligaments, tendons, discs, and nerves of the neck are susceptible to injury when this occurs. RISK FACTORS Risk of having a whiplash injury increases if:  Osteoarthritis of the spine.  Situations that make head or neck accidents or trauma more likely.  High-risk sports (football, rugby, wrestling, hockey, auto racing, gymnastics, diving, contact karate, or boxing).  Poor strength and flexibility of the neck.  Previous neck injury.  Poor tackling technique.  Improperly fitted or padded equipment. SYMPTOMS   Pain or stiffness in the front or back of neck or both.  Symptoms may present immediately or up to 24 hours after injury.  Dizziness, headache, nausea, and vomiting.  Muscle spasm with soreness and stiffness in the neck.  Tenderness and swelling at the injury site. PREVENTION  Learn and use proper technique (avoid tackling with the head, spearing, and head-butting; use proper falling techniques to avoid landing on the head).  Warm up and stretch properly before activity.  Maintain physical fitness:  Strength, flexibility, and endurance.  Cardiovascular fitness.  Wear properly fitted and padded protective equipment, such as padded soft collars, for participation in contact sports. PROGNOSIS  Recovery from cervical strain and sprain injuries is dependent on the extent of the injury. These injuries are usually curable in 1 week to 3 months with appropriate treatment.  RELATED COMPLICATIONS   Temporary numbness and weakness may occur if the nerve roots are damaged, and this may persist until the nerve has completely healed.  Chronic pain due to frequent recurrence of symptoms.  Prolonged healing,  especially if activity is resumed too soon (before complete recovery). TREATMENT  Treatment initially involves the use of ice and medication to help reduce pain and inflammation. It is also important to perform strengthening and stretching exercises and modify activities that worsen symptoms so the injury does not get worse. These exercises may be performed at home or with a therapist. For patients who experience severe symptoms, a soft, padded collar may be recommended to be worn around the neck.  Improving your posture may help reduce symptoms. Posture improvement includes pulling your chin and abdomen in while sitting or standing. If you are sitting, sit in a firm chair with your buttocks against the back of the chair. While sleeping, try replacing your pillow with a small towel rolled to 2 inches in diameter, or use a cervical pillow or soft cervical collar. Poor sleeping positions delay healing.  For patients with nerve root damage, which causes numbness or weakness, the use of a cervical traction apparatus may be recommended. Surgery is rarely necessary for these injuries. However, cervical strain and sprains that are present at birth (congenital) may require surgery. MEDICATION   If pain medication is necessary, nonsteroidal anti-inflammatory medications, such as aspirin and ibuprofen, or other minor pain relievers, such as acetaminophen, are often recommended.  Do not take pain medication for 7 days before surgery.  Prescription pain relievers may be given if deemed necessary by your caregiver. Use only as directed and only as much as you need. HEAT AND COLD:   Cold treatment (icing) relieves pain and reduces inflammation. Cold treatment should be applied for 10 to 15 minutes every 2 to 3 hours for inflammation and pain and immediately after any activity that aggravates your  HEAT AND COLD:   · Cold treatment (icing) relieves pain and reduces inflammation. Cold treatment should be applied for 10 to 15 minutes every 2 to 3 hours for inflammation and pain and immediately after any activity that aggravates your symptoms. Use ice packs or an ice massage.  · Heat treatment may be used prior to performing the stretching and  strengthening activities prescribed by your caregiver, physical therapist, or athletic trainer. Use a heat pack or a warm soak.  SEEK MEDICAL CARE IF:   · Symptoms get worse or do not improve in 2 weeks despite treatment.  · New, unexplained symptoms develop (drugs used in treatment may produce side effects).  EXERCISES  RANGE OF MOTION (ROM) AND STRETCHING EXERCISES - Cervical Strain and Sprain  These exercises may help you when beginning to rehabilitate your injury. In order to successfully resolve your symptoms, you must improve your posture. These exercises are designed to help reduce the forward-head and rounded-shoulder posture which contributes to this condition. Your symptoms may resolve with or without further involvement from your physician, physical therapist or athletic trainer. While completing these exercises, remember:   · Restoring tissue flexibility helps normal motion to return to the joints. This allows healthier, less painful movement and activity.  · An effective stretch should be held for at least 20 seconds, although you may need to begin with shorter hold times for comfort.  · A stretch should never be painful. You should only feel a gentle lengthening or release in the stretched tissue.  STRETCH- Axial Extensors  · Lie on your back on the floor. You may bend your knees for comfort. Place a rolled-up hand towel or dish towel, about 2 inches in diameter, under the part of your head that makes contact with the floor.  · Gently tuck your chin, as if trying to make a "double chin," until you feel a gentle stretch at the base of your head.  · Hold __________ seconds.  Repeat __________ times. Complete this exercise __________ times per day.   STRETCH - Axial Extension   · Stand or sit on a firm surface. Assume a good posture: chest up, shoulders drawn back, abdominal muscles slightly tense, knees unlocked (if standing) and feet hip width apart.  · Slowly retract your chin so your head slides back  and your chin slightly lowers. Continue to look straight ahead.  · You should feel a gentle stretch in the back of your head. Be certain not to feel an aggressive stretch since this can cause headaches later.  · Hold for __________ seconds.  Repeat __________ times. Complete this exercise __________ times per day.  STRETCH - Cervical Side Bend   · Stand or sit on a firm surface. Assume a good posture: chest up, shoulders drawn back, abdominal muscles slightly tense, knees unlocked (if standing) and feet hip width apart.  · Without letting your nose or shoulders move, slowly tip your right / left ear to your shoulder until your feel a gentle stretch in the muscles on the opposite side of your neck.  · Hold __________ seconds.  Repeat __________ times. Complete this exercise __________ times per day.  STRETCH - Cervical Rotators   · Stand or sit on a firm surface. Assume a good posture: chest up, shoulders drawn back, abdominal muscles slightly tense, knees unlocked (if standing) and feet hip width apart.  · Keeping your eyes level with the ground, slowly turn your head until you feel a gentle stretch along   the back and opposite side of your neck.  · Hold __________ seconds.  Repeat __________ times. Complete this exercise __________ times per day.  RANGE OF MOTION - Neck Circles   · Stand or sit on a firm surface. Assume a good posture: chest up, shoulders drawn back, abdominal muscles slightly tense, knees unlocked (if standing) and feet hip width apart.  · Gently roll your head down and around from the back of one shoulder to the back of the other. The motion should never be forced or painful.  · Repeat the motion 10-20 times, or until you feel the neck muscles relax and loosen.  Repeat __________ times. Complete the exercise __________ times per day.  STRENGTHENING EXERCISES - Cervical Strain and Sprain  These exercises may help you when beginning to rehabilitate your injury. They may resolve your symptoms with or  without further involvement from your physician, physical therapist, or athletic trainer. While completing these exercises, remember:   · Muscles can gain both the endurance and the strength needed for everyday activities through controlled exercises.  · Complete these exercises as instructed by your physician, physical therapist, or athletic trainer. Progress the resistance and repetitions only as guided.  · You may experience muscle soreness or fatigue, but the pain or discomfort you are trying to eliminate should never worsen during these exercises. If this pain does worsen, stop and make certain you are following the directions exactly. If the pain is still present after adjustments, discontinue the exercise until you can discuss the trouble with your clinician.  STRENGTH - Cervical Flexors, Isometric  · Face a wall, standing about 6 inches away. Place a small pillow, a ball about 6-8 inches in diameter, or a folded towel between your forehead and the wall.  · Slightly tuck your chin and gently push your forehead into the soft object. Push only with mild to moderate intensity, building up tension gradually. Keep your jaw and forehead relaxed.  · Hold 10 to 20 seconds. Keep your breathing relaxed.  · Release the tension slowly. Relax your neck muscles completely before you start the next repetition.  Repeat __________ times. Complete this exercise __________ times per day.  STRENGTH- Cervical Lateral Flexors, Isometric   · Stand about 6 inches away from a wall. Place a small pillow, a ball about 6-8 inches in diameter, or a folded towel between the side of your head and the wall.  · Slightly tuck your chin and gently tilt your head into the soft object. Push only with mild to moderate intensity, building up tension gradually. Keep your jaw and forehead relaxed.  · Hold 10 to 20 seconds. Keep your breathing relaxed.  · Release the tension slowly. Relax your neck muscles completely before you start the next  repetition.  Repeat __________ times. Complete this exercise __________ times per day.  STRENGTH - Cervical Extensors, Isometric   · Stand about 6 inches away from a wall. Place a small pillow, a ball about 6-8 inches in diameter, or a folded towel between the back of your head and the wall.  · Slightly tuck your chin and gently tilt your head back into the soft object. Push only with mild to moderate intensity, building up tension gradually. Keep your jaw and forehead relaxed.  · Hold 10 to 20 seconds. Keep your breathing relaxed.  · Release the tension slowly. Relax your neck muscles completely before you start the next repetition.  Repeat __________ times. Complete this exercise __________ times per day.    All of your joints have less wear and tear when properly supported by a spine with good posture. This means you will experience a healthier, less painful body.  Correct posture must be practiced with all of your activities, especially prolonged sitting and standing. Correct posture is as important when doing repetitive low-stress activities (typing) as it is when doing a single heavy-load activity (lifting). PROLONGED STANDING WHILE SLIGHTLY LEANING FORWARD When completing a task that requires you to lean forward while standing in one  place for a long time, place either foot up on a stationary 2- to 4-inch high object to help maintain the best posture. When both feet are on the ground, the low back tends to lose its slight inward curve. If this curve flattens (or becomes too large), then the back and your other joints will experience too much stress, fatigue more quickly, and can cause pain.  RESTING POSITIONS Consider which positions are most painful for you when choosing a resting position. If you have pain with flexion-based activities (sitting, bending, stooping, squatting), choose a position that allows you to rest in a less flexed posture. You would want to avoid curling into a fetal position on your side. If your pain worsens with extension-based activities (prolonged standing, working overhead), avoid resting in an extended position such as sleeping on your stomach. Most people will find more comfort when they rest with their spine in a more neutral position, neither too rounded nor too arched. Lying on a non-sagging bed on your side with a pillow between your knees, or on your back with a pillow under your knees will often provide some relief. Keep in mind, being in any one position for a prolonged period of time, no matter how correct your posture, can still lead to stiffness. WALKING Walk with an upright posture. Your ears, shoulders, and hips should all line up. OFFICE WORK When working at a desk, create an environment that supports good, upright posture. Without extra support, muscles fatigue and lead to excessive strain on joints and other tissues. CHAIR:  A chair should be able to slide under your desk when your back makes contact with the back of the chair. This allows you to work closely.  The chair's height should allow your eyes to be level with the upper part of your monitor and your hands to be slightly lower than your elbows.  Body position:  Your feet should make contact with the floor. If this is not  possible, use a foot rest.  Keep your ears over your shoulders. This will reduce stress on your neck and low back.   This information is not intended to replace advice given to you by your health care provider. Make sure you discuss any questions you have with your health care provider.   Document Released: 10/26/2005 Document Revised: 11/16/2014 Document Reviewed: 02/07/2009 [employer] Interactive Patient Education 2016 [employer]  Cryotherapy Cryotherapy is when you put ice on your injury. Ice helps lessen pain and puffiness (swelling) after an injury. Ice works the best when you start using it in the first 24 to 48 hours after an injury. HOME CARE  Put a dry or damp towel between the ice pack and your skin.  You may press gently on the ice pack.  Leave the ice on for no more than 10 to 20 minutes at a time.  Check your skin after 5 minutes to make sure your skin is okay.  Rest at least 20 minutes between ice pack  uses. °· Stop using ice when your skin loses feeling (numbness). °· Do not use ice on someone who cannot tell you when it hurts. This includes small children and people with memory problems (dementia). °GET HELP RIGHT AWAY IF: °· You have white spots on your skin. °· Your skin turns blue or pale. °· Your skin feels waxy or hard. °· Your puffiness gets worse. °MAKE SURE YOU:  °· Understand these instructions. °· Will watch your condition. °· Will get help right away if you are not doing well or get worse. °  °This information is not intended to replace advice given to you by your health care provider. Make sure you discuss any questions you have with your health care provider. °  °Document Released: 04/13/2008 Document Revised: 01/18/2012 Document Reviewed: 06/18/2011 °[employer]vier Interactive Patient Education ©2016 [employer] ° °

## 2016-01-26 ENCOUNTER — Encounter: Payer: Self-pay | Admitting: Emergency Medicine

## 2016-01-26 ENCOUNTER — Emergency Department
Admission: EM | Admit: 2016-01-26 | Discharge: 2016-01-26 | Disposition: A | Discharge status: Home / Self Care | Payer: Managed Care, Other (non HMO) | Attending: Emergency Medicine | Admitting: Emergency Medicine

## 2016-01-26 ENCOUNTER — Emergency Department: Payer: Managed Care, Other (non HMO)

## 2016-01-26 DIAGNOSIS — J40 Bronchitis, not specified as acute or chronic: Secondary | ICD-10-CM

## 2016-01-26 DIAGNOSIS — R042 Hemoptysis: Secondary | ICD-10-CM | POA: Diagnosis present

## 2016-01-26 DIAGNOSIS — F17210 Nicotine dependence, cigarettes, uncomplicated: Secondary | ICD-10-CM | POA: Diagnosis not present

## 2016-01-26 DIAGNOSIS — J45909 Unspecified asthma, uncomplicated: Secondary | ICD-10-CM | POA: Diagnosis not present

## 2016-01-26 DIAGNOSIS — Z79899 Other long term (current) drug therapy: Secondary | ICD-10-CM | POA: Diagnosis not present

## 2016-01-26 DIAGNOSIS — Z7952 Long term (current) use of systemic steroids: Secondary | ICD-10-CM | POA: Diagnosis not present

## 2016-01-26 HISTORY — DX: Other pulmonary embolism without acute cor pulmonale: I26.99

## 2016-01-26 MED ORDER — AZITHROMYCIN 250 MG PO TABS: ORAL_TABLET | ORAL | Status: AC

## 2016-01-26 NOTE — ED Provider Notes (Signed)
[facility] Emergency Department Provider Note  ____________________________________________    I have reviewed the triage vital signs and the nursing notes.   HISTORY  Chief Complaint Hemoptysis    HPI Joy Ramos is a 30 y.o. female who presents with cough for approximately 3 weeks. She reports her entire family has had a cough as well. Today she reports she coughed up mucus streaked with blood and became concerned. She denies shortness of breath. Her chest feels congested but is not painful. She has no pleurisy. She reports a history of pulmonary embolus, in reviewing on care everywhere patient actually had apparent septic pulmonary emboli likely from dental source and 2013 which is treated with antibiotics. She reports this feels nothing like when that occurred. No recent travel. No calf pain. No fevers     Past Medical History  Diagnosis Date  . Asthma   . PE (pulmonary embolism)     There are no active problems to display for this patient.   Past Surgical History  Procedure Laterality Date  . Cesarean section      x3  . Tubal ligation      Current Outpatient Rx  Name  Route  Sig  Dispense  Refill  . bifidobacterium infantis (ALIGN) capsule   Oral   Take 1 capsule by mouth daily.   90 capsule   0   . cyclobenzaprine (FLEXERIL) 10 MG tablet   Oral   Take 1 tablet (10 mg total) by mouth 3 (three) times daily as needed for muscle spasms.   21 tablet   0   . emtricitabine-tenofovir (TRUVADA) 200-300 MG tablet   Oral   Take 1 tablet by mouth daily.   30 tablet   0   . fluconazole (DIFLUCAN) 150 MG tablet   Oral   Take 1 tablet (150 mg total) by mouth daily.   2 tablet   0     Take 1 tab today and then the next in 72 hours   . hydrocortisone (ANUSOL-HC) 2.5 % rectal cream      Apply rectally 2 times daily   30 g   1   . naproxen (NAPROSYN) 500 MG tablet   Oral   Take 1 tablet (500 mg total) by mouth 2 (two) times daily  with a meal.   14 tablet   0   . raltegravir (ISENTRESS) 400 MG tablet   Oral   Take 1 tablet (400 mg total) by mouth 2 (two) times daily.   60 tablet   0   . Zinc Oxide 16 % OINT   Apply externally   Apply 1 application topically as needed.   1 Tube   2     Allergies Morphine and related and Vicodin  History reviewed. No pertinent family history.  Social History Social History  Substance Use Topics  . Smoking status: Current Every Day Smoker -- 1.50 packs/day    Types: Cigarettes  . Smokeless tobacco: Never Used  . Alcohol Use: No    Review of Systems  Constitutional: Negative for fever. Eyes: Negative for redness ENT: Negative for sore throat Cardiovascular: Chest feels congested Respiratory: Negative for shortness of breath. Positive for cough Gastrointestinal: Negative for abdominal pain Genitourinary: Negative for dysuria. Musculoskeletal: Negative for back pain. Skin: Negative for rash. Neurological: Negative for focal weakness Psychiatric: no anxiety    ____________________________________________   PHYSICAL EXAM:  VITAL SIGNS: ED Triage Vitals  Enc Vitals Group     BP 01/26/16  0658 124/94 mmHg     Pulse Rate 01/26/16 0658 86     Resp 01/26/16 0658 18     Temp 01/26/16 0658 98.6 F (37 C)     Temp Source 01/26/16 0658 Oral     SpO2 01/26/16 0658 100 %     Weight 01/26/16 0658 202 lb (91.627 kg)     Height 01/26/16 0658 5\' 5"  (1.651 m)     Head Cir --      Peak Flow --      Pain Score 01/26/16 0658 0     Pain Loc --      Pain Edu? --      Excl. in GC? --      Constitutional: Alert and oriented. Well appearing and in no distress.  Eyes: Conjunctivae are normal. No erythema or injection ENT   Head: Normocephalic and atraumatic.   Mouth/Throat: Mucous membranes are moist. Cardiovascular: Normal rate, regular rhythm. Normal and symmetric distal pulses are present in the upper extremities.  Respiratory: Normal respiratory effort  without tachypnea nor retractions. Clear to auscultation bilaterally  Gastrointestinal: Soft and non-tender in all quadrants. No distention. There is no CVA tenderness. Genitourinary: deferred Musculoskeletal: Nontender with normal range of motion in all extremities. No lower extremity tenderness nor edema. Neurologic:  Normal speech and language. No gross focal neurologic deficits are appreciated. Skin:  Skin is warm, dry and intact. No rash noted. Psychiatric: Mood and affect are normal. Patient exhibits appropriate insight and judgment.  ____________________________________________    LABS (pertinent positives/negatives)  Labs Reviewed - No data to display  ____________________________________________   EKG  None  ____________________________________________    RADIOLOGY  Chest x-ray unremarkable  ____________________________________________   PROCEDURES  Procedure(s) performed: none  Critical Care performed: none  ____________________________________________   INITIAL IMPRESSION / ASSESSMENT AND PLAN / ED COURSE  Pertinent labs & imaging results that were available during my care of the patient were reviewed by me and considered in my medical decision making (see chart for details).  Patient with hemoptysis but no pleurisy and normal heart rate. Feel she is low risk for PE. We'll check chest x-ray initially   Chest x-ray is benign. Patient is well-appearing and in no distress. She does not want CT scan to evaluate lungs further. She will return if her condition worsens. ____________________________________________   FINAL CLINICAL IMPRESSION(S) / ED DIAGNOSES  Final diagnoses:  Bronchitis  Hemoptysis          Jene Everyobert [place] F[place], MD 01/26/16 1231

## 2016-01-26 NOTE — ED Notes (Signed)
Patient c/o productive cough with blood tinged sputum and pain when taking a deep breath. Patient states that the cough has been going on for about 3 weeks. Patient also states that she has a hx/o PE in 2013.

## 2016-01-26 NOTE — Discharge Instructions (Signed)
Hemoptysis  Hemoptysis, which means coughing up blood, can be a sign of a minor problem or a serious medical condition. The blood that is coughed up may come from the lungs and airways. Coughed-up blood can also come from bleeding that occurs outside the lungs and airways. Blood can drain into the windpipe during a severe nosebleed or when blood is vomited from the stomach. Because hemoptysis can be a sign of something serious, a medical evaluation is required. For some people with hemoptysis, no definite cause is ever identified.  CAUSES   The most common cause of hemoptysis is bronchitis. Some other common causes include:   · A ruptured blood vessel caused by coughing or an infection.    · A medical condition that causes damage to the large air passageways (bronchiectasis).    · A blood clot in the lungs (pulmonary embolism).    · Pneumonia.    · Tuberculosis.    · Breathing in a small foreign object.    · Cancer.  For some people with hemoptysis, no definite cause is ever identified.    HOME CARE INSTRUCTIONS  · Only take over-the-counter or prescription medicines as directed by your caregiver. Do not use cough suppressants unless your caregiver approves.  · If your caregiver prescribes antibiotic medicines, take them as directed. Finish them even if you start to feel better.  · Do not smoke. Also avoid secondhand smoke.  · Follow up with your caregiver as directed.  SEEK IMMEDIATE MEDICAL CARE IF:   · You cough up bloody mucus for longer than a week.  · You have a blood-producing cough that is severe or getting worse.  · You have a blood-producing cough that comes and goes over time.  · You develop problems with your breathing.    · You vomit blood.  · You develop bloody or black-colored stools.  · You have chest pain.    · You develop night sweats.  · You feel faint or pass out.    · You have a fever or persistent symptoms for more than 2-3 days.    · You have a fever and your symptoms suddenly get worse.  MAKE  SURE YOU:  · Understand these instructions.  · Will watch your condition.  · Will get help right away if you are not doing well or get worse.     This information is not intended to replace advice given to you by your health care provider. Make sure you discuss any questions you have with your health care provider.     Document Released: 01/04/2002 Document Revised: 10/12/2012 Document Reviewed: 08/12/2012  Elsevier Interactive Patient Education ©2016 [employer]

## 2016-01-26 NOTE — ED Notes (Signed)
Pt states almost one month of uri symptoms and cough. Pt states now has bright red blood in sputum when she coughs. Pt states history of PE in past. Pt denies pain, states has felt like she has had chills. Pt denies shob. Pt states "it's just like clots now, not streaks anymore."

## 2016-04-23 ENCOUNTER — Emergency Department
Admission: EM | Admit: 2016-04-23 | Discharge: 2016-04-23 | Disposition: A | Discharge status: Home / Self Care | Payer: Managed Care, Other (non HMO) | Attending: Emergency Medicine | Admitting: Emergency Medicine

## 2016-04-23 ENCOUNTER — Encounter: Payer: Self-pay | Admitting: *Deleted

## 2016-04-23 ENCOUNTER — Emergency Department: Payer: Managed Care, Other (non HMO)

## 2016-04-23 DIAGNOSIS — R51 Headache: Secondary | ICD-10-CM | POA: Diagnosis present

## 2016-04-23 DIAGNOSIS — Y999 Unspecified external cause status: Secondary | ICD-10-CM | POA: Insufficient documentation

## 2016-04-23 DIAGNOSIS — S0083XA Contusion of other part of head, initial encounter: Secondary | ICD-10-CM | POA: Diagnosis not present

## 2016-04-23 DIAGNOSIS — Y929 Unspecified place or not applicable: Secondary | ICD-10-CM | POA: Insufficient documentation

## 2016-04-23 DIAGNOSIS — Y939 Activity, unspecified: Secondary | ICD-10-CM | POA: Diagnosis not present

## 2016-04-23 MED ORDER — IBUPROFEN 600 MG PO TABS: 600.0000 mg | ORAL_TABLET | Freq: Three times a day (TID) | ORAL | Status: DC | PRN

## 2016-04-23 MED ORDER — CYCLOBENZAPRINE HCL 10 MG PO TABS: 10.0000 mg | ORAL_TABLET | Freq: Three times a day (TID) | ORAL | Status: DC | PRN

## 2016-04-23 MED ORDER — OXYCODONE-ACETAMINOPHEN 5-325 MG PO TABS: 1.0000 | ORAL_TABLET | ORAL | Status: DC | PRN

## 2016-04-23 MED ORDER — KETOROLAC TROMETHAMINE 60 MG/2ML IM SOLN
60.0000 mg | Freq: Once | INTRAMUSCULAR | Status: DC
  Filled 2016-04-23: qty 2

## 2016-04-23 NOTE — ED Notes (Signed)
This nurse offered to call the [facility] nurse for her . Pt refused  States she has had pictures taken .
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
## 2016-04-23 NOTE — Discharge Instructions (Signed)
Facial or Scalp Contusion ° A facial or scalp contusion is a deep bruise on the face or head. Contusions happen when an injury causes bleeding under the skin. Signs of bruising include pain, puffiness (swelling), and discolored skin. The contusion may turn blue, purple, or yellow. °HOME CARE °· Only take medicines as told by your doctor. °· Put ice on the injured area. °¨ Put ice in a plastic bag. °¨ Place a towel between your skin and the bag. °¨ Leave the ice on for 20 minutes, 2-3 times a day. °GET HELP IF: °· You have bite problems. °· You have pain when chewing. °· You are worried about your face not healing normally. °GET HELP RIGHT AWAY IF:  °· You have severe pain or a headache and medicine does not help. °· You are very tired or confused, or your personality changes. °· You throw up (vomit). °· You have a nosebleed that will not stop. °· You see two of everything (double vision) or have blurry vision. °· You have fluid coming from your nose or ear. °· You have problems walking or using your arms or legs. °MAKE SURE YOU:  °· Understand these instructions. °· Will watch your condition. °· Will get help right away if you are not doing well or get worse. °  °This information is not intended to replace advice given to you by your health care provider. Make sure you discuss any questions you have with your health care provider. °  °Document Released: 10/15/2011 Document Revised: 11/16/2014 Document Reviewed: 06/08/2013 °Elsevier Interactive Patient Education ©2016 Elsevier Inc. ° °

## 2016-10-21 IMAGING — CR DG CHEST 2V
2 series · 2 of 2 positions shown · non-contrast
Comparison: March 26, 2014

CLINICAL DATA: Cough for 1 month.  Hemoptysis for 1 day

EXAM:
CHEST  2 VIEW

[chest pa]
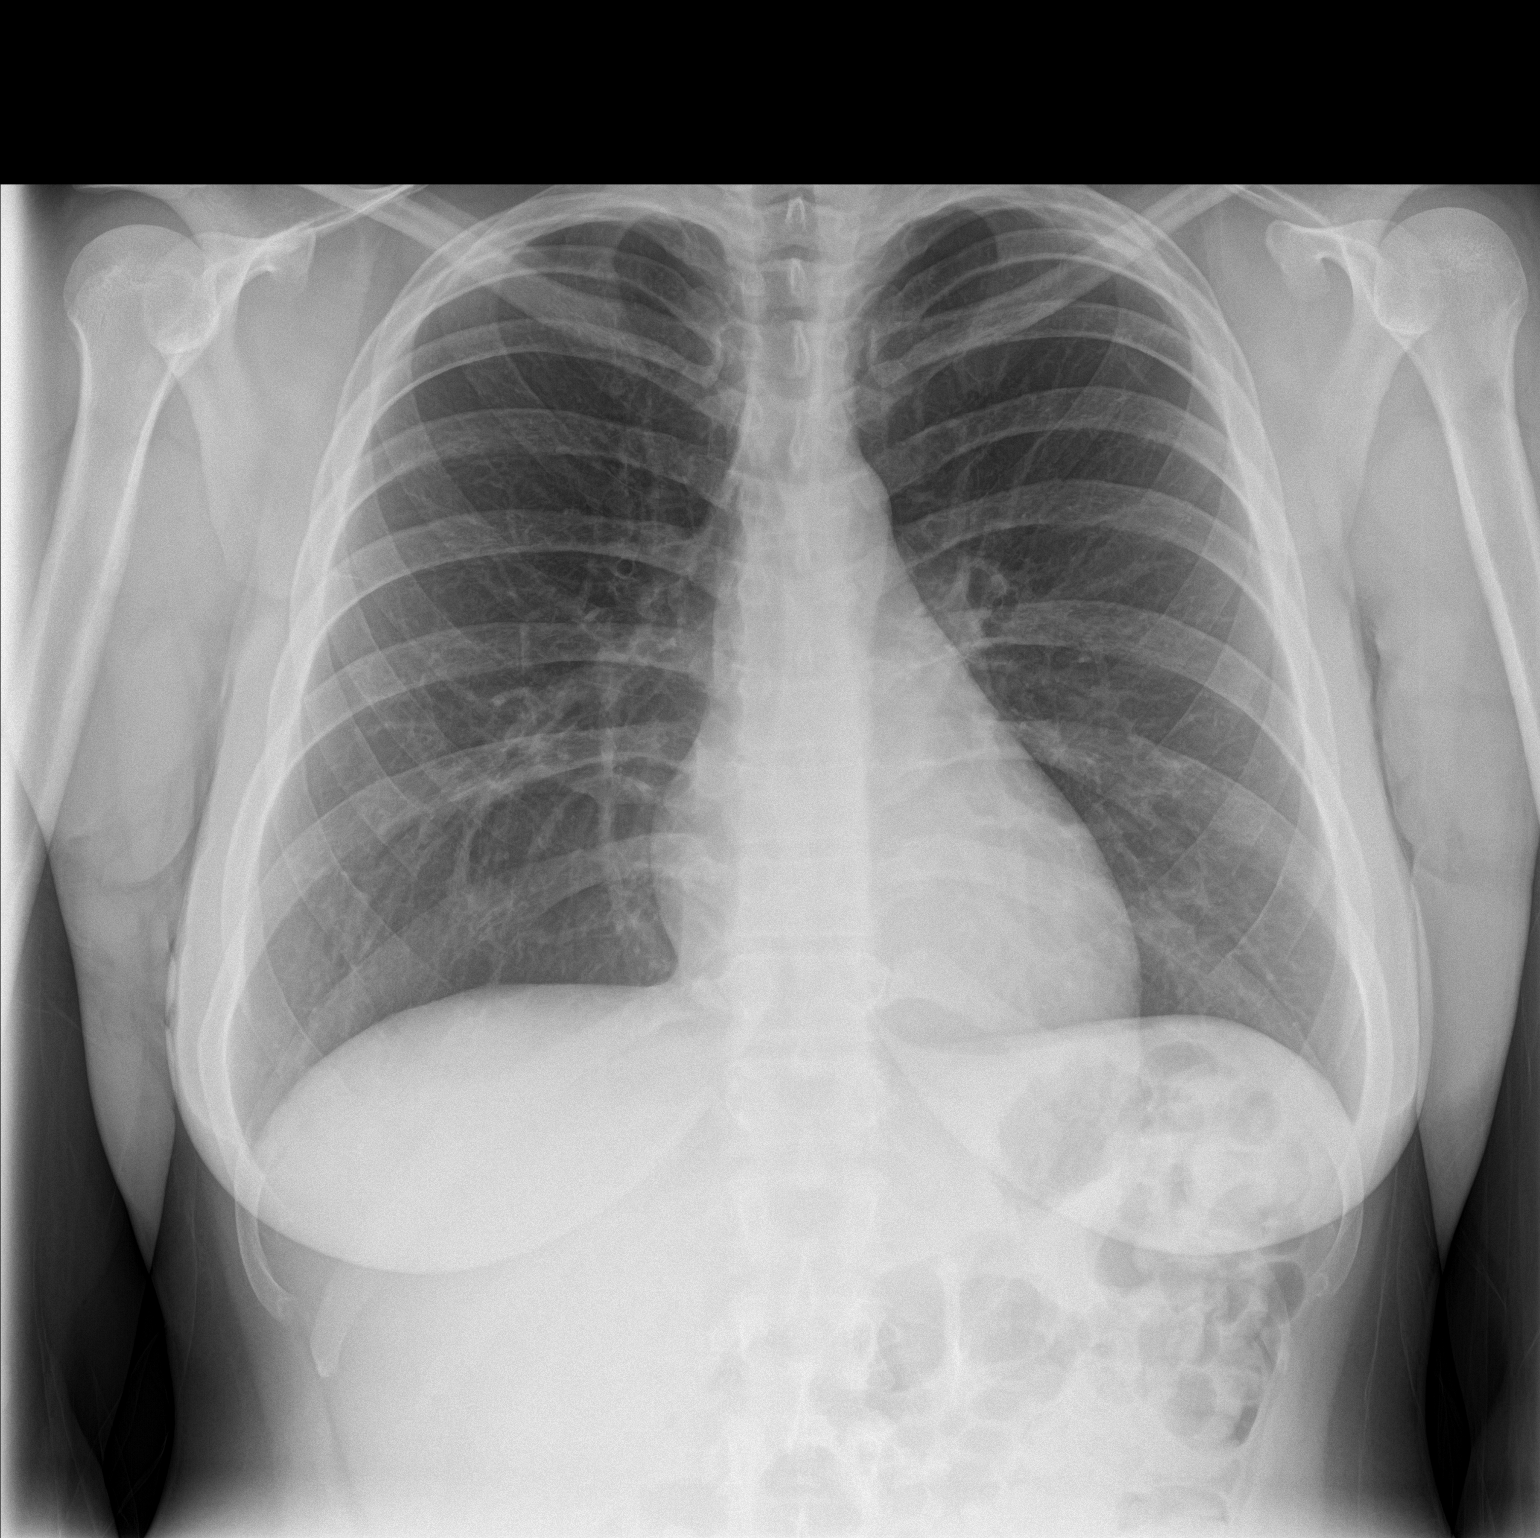

[chest lat]
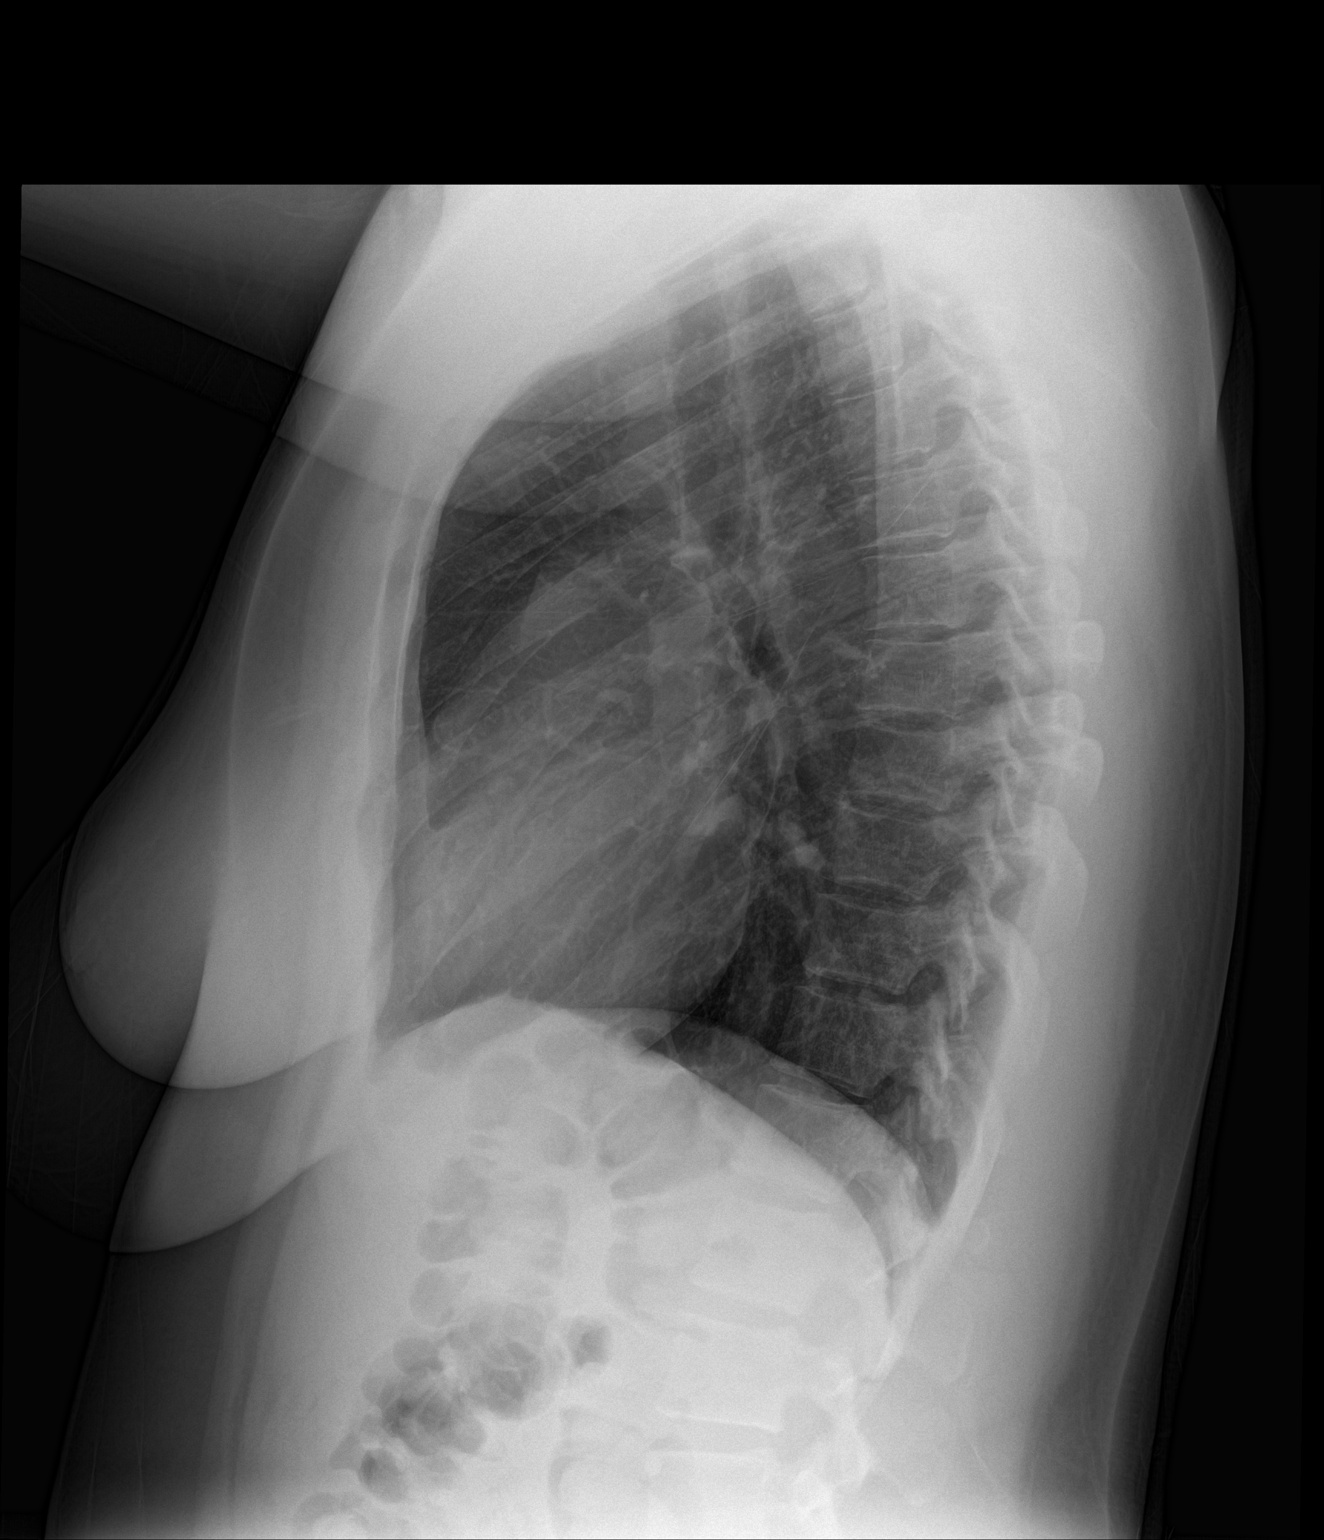

[2 of 2 positions shown; findings below may reference images not displayed]

FINDINGS: Lungs are clear. The heart size and pulmonary vascularity are
normal. No adenopathy. No bone lesions.
IMPRESSION: No edema or consolidation.

## 2016-10-22 ENCOUNTER — Emergency Department
Admission: EM | Admit: 2016-10-22 | Discharge: 2016-10-22 | Disposition: A | Discharge status: Home / Self Care | Payer: Managed Care, Other (non HMO) | Attending: Emergency Medicine | Admitting: Emergency Medicine

## 2016-10-22 ENCOUNTER — Encounter: Payer: Self-pay | Admitting: Emergency Medicine

## 2016-10-22 ENCOUNTER — Telehealth: Payer: Self-pay | Admitting: Emergency Medicine

## 2016-10-22 DIAGNOSIS — B09 Unspecified viral infection characterized by skin and mucous membrane lesions: Secondary | ICD-10-CM | POA: Insufficient documentation

## 2016-10-22 DIAGNOSIS — F17210 Nicotine dependence, cigarettes, uncomplicated: Secondary | ICD-10-CM | POA: Insufficient documentation

## 2016-10-22 DIAGNOSIS — Z79899 Other long term (current) drug therapy: Secondary | ICD-10-CM | POA: Insufficient documentation

## 2016-10-22 DIAGNOSIS — R21 Rash and other nonspecific skin eruption: Secondary | ICD-10-CM | POA: Diagnosis present

## 2016-10-22 DIAGNOSIS — J45909 Unspecified asthma, uncomplicated: Secondary | ICD-10-CM | POA: Insufficient documentation

## 2016-10-22 MED ORDER — METHYLPREDNISOLONE 4 MG PO TBPK: ORAL_TABLET | ORAL | 0 refills | Status: DC

## 2016-10-22 MED ORDER — HYDROXYZINE HCL 10 MG/5ML PO SYRP: 10.0000 mg | ORAL_SOLUTION | Freq: Three times a day (TID) | ORAL | 0 refills | Status: DC | PRN

## 2016-10-22 MED ORDER — HYDROXYZINE HCL 50 MG PO TABS
50.0000 mg | ORAL_TABLET | Freq: Once | ORAL | Status: AC
  Administered 2016-10-22: 50 mg via ORAL
  Filled 2016-10-22: qty 1

## 2016-10-22 MED ORDER — METHYLPREDNISOLONE SODIUM SUCC 125 MG IJ SOLR
80.0000 mg | Freq: Once | INTRAMUSCULAR | Status: AC
  Administered 2016-10-22: 80 mg via INTRAVENOUS
  Filled 2016-10-22: qty 2

## 2016-10-22 MED ORDER — TRAMADOL HCL 50 MG PO TABS: 50.0000 mg | ORAL_TABLET | Freq: Four times a day (QID) | ORAL | 0 refills | Status: AC | PRN

## 2016-10-22 NOTE — ED Notes (Signed)
See triage note  States she shaved her legs and developed a red area   This am she has a small red raised area noted to inner right thigh

## 2016-10-22 NOTE — ED Triage Notes (Signed)
Pt presents to ED with c/o small area of rash to inner R thigh. Pt states area started last night and today has progressed. Pt noted to have a small approx 1.5in in diameter area noted to be red with small blister-like eruptions on the skin.

## 2016-10-22 NOTE — ED Provider Notes (Signed)
[facility] Emergency Department Provider Note   ____________________________________________   None    (approximate)  I have reviewed the triage vital signs and the nursing notes.   HISTORY  Chief Complaint Rash    HPI Joy Ramos is a 30 y.o. female patient complain of a rash right medial thigh which occurred last night. Patient states she noticed a small bump on written skin at that she shaved. Patient states she awakened this morning with small blisterlike eruptions at the site.Patient rates the pain as 8/10. Describes the pain as "stinging". Patient stated mild transient relief with ibuprofen. Patient denies any fever or chills associated with this complaint.   Past Medical History:  Diagnosis Date  . Asthma   . PE (pulmonary embolism)     There are no active problems to display for this patient.   Past Surgical History:  Procedure Laterality Date  . CESAREAN SECTION     x3  . TUBAL LIGATION      Prior to Admission medications   Medication Sig Start Date End Date Taking? Authorizing Provider  bifidobacterium infantis (ALIGN) capsule Take 1 capsule by mouth daily. 08/13/15   Jene Everyobert Kinner, MD  cyclobenzaprine (FLEXERIL) 10 MG tablet Take 1 tablet (10 mg total) by mouth 3 (three) times daily as needed for muscle spasms. 01/14/16   Jami L Hagler, PA-C  cyclobenzaprine (FLEXERIL) 10 MG tablet Take 1 tablet (10 mg total) by mouth every 8 (eight) hours as needed for muscle spasms. 04/23/16   Joni Reiningonald K Eva Griffo, PA-C  emtricitabine-tenofovir (TRUVADA) 200-300 MG tablet Take 1 tablet by mouth daily. 08/13/15   Jene Everyobert Kinner, MD  fluconazole (DIFLUCAN) 150 MG tablet Take 1 tablet (150 mg total) by mouth daily. 08/13/15   Jene Everyobert Kinner, MD  hydrocortisone (ANUSOL-HC) 2.5 % rectal cream Apply rectally 2 times daily 01/04/16 01/03/17  Charmayne Sheerharles M Beers, PA-C  hydrOXYzine (ATARAX) 10 MG/5ML syrup Take 5 mLs (10 mg total) by mouth 3 (three) times daily as needed  for itching. 10/22/16   Joni Reiningonald K Ward Boissonneault, PA-C  ibuprofen (ADVIL,MOTRIN) 600 MG tablet Take 1 tablet (600 mg total) by mouth every 8 (eight) hours as needed. 04/23/16   Joni Reiningonald K Eleesha Purkey, PA-C  methylPREDNISolone (MEDROL DOSEPAK) 4 MG TBPK tablet Take Tapered dose as directed 10/22/16   Joni Reiningonald K Vishal Sandlin, PA-C  naproxen (NAPROSYN) 500 MG tablet Take 1 tablet (500 mg total) by mouth 2 (two) times daily with a meal. 01/14/16   Jami L Hagler, PA-C  oxyCODONE-acetaminophen (ROXICET) 5-325 MG tablet Take 1 tablet by mouth every 4 (four) hours as needed for severe pain. 04/23/16   Joni Reiningonald K Deward Sebek, PA-C  raltegravir (ISENTRESS) 400 MG tablet Take 1 tablet (400 mg total) by mouth 2 (two) times daily. 08/13/15   Jene Everyobert Kinner, MD  traMADol (ULTRAM) 50 MG tablet Take 1 tablet (50 mg total) by mouth every 6 (six) hours as needed. 10/22/16 10/22/17  Joni Reiningonald K Kebrina Friend, PA-C  Zinc Oxide 16 % OINT Apply 1 application topically as needed. 01/04/16   Evangeline Dakinharles M Beers, PA-C    Allergies Morphine and related; Penicillins; and Vicodin [hydrocodone-acetaminophen]  History reviewed. No pertinent family history.  Social History Social History  Substance Use Topics  . Smoking status: Current Every Day Smoker    Packs/day: 1.50    Types: Cigarettes  . Smokeless tobacco: Never Used  . Alcohol use Yes    Review of Systems Constitutional: No fever/chills Eyes: No visual changes. ENT: No sore throat. Cardiovascular: Denies  chest pain. Respiratory: Denies shortness of breath. Gastrointestinal: No abdominal pain.  No nausea, no vomiting.  No diarrhea.  No constipation. Genitourinary: Negative for dysuria. Musculoskeletal: Negative for back pain. Skin: Rash or aspect right thigh.  Neurological: Negative for headaches, focal weakness or numbness. Allergic/Immunilogical: Penicillin.  ____________________________________________   PHYSICAL EXAM:  VITAL SIGNS: ED Triage Vitals [10/22/16 0812]  Enc Vitals Group     BP 102/70       Pulse Rate 84     Resp 18     Temp 98.7 F (37.1 C)     Temp Source Oral     SpO2 99 %     Weight 196 lb (88.9 kg)     Height 5\' 5"  (1.651 m)     Head Circumference      Peak Flow      Pain Score 8     Pain Loc      Pain Edu?      Excl. in GC?     Constitutional: Alert and oriented. Well appearing and in no acute distress. Eyes: Conjunctivae are normal. PERRL. EOMI. Head: Atraumatic. Nose: No congestion/rhinnorhea. Mouth/Throat: Mucous membranes are moist.  Oropharynx non-erythematous. Neck: No stridor.  No cervical spine tenderness to palpation. Hematological/Lymphatic/Immunilogical: No cervical lymphadenopathy. Cardiovascular: Normal rate, regular rhythm. Grossly normal heart sounds.  Good peripheral circulation. Respiratory: Normal respiratory effort.  No retractions. Lungs CTAB. Gastrointestinal: Soft and nontender. No distention. No abdominal bruits. No CVA tenderness. Musculoskeletal: No lower extremity tenderness nor edema.  No joint effusions. Neurologic:  Normal speech and language. No gross focal neurologic deficits are appreciated. No gait instability. Skin:  Skin is warm, dry and intact. No rash noted. Psychiatric: Mood and affect are normal. Speech and behavior are normal.  ____________________________________________   LABS (all labs ordered are listed, but only abnormal results are displayed)  Labs Reviewed - No data to display ____________________________________________  EKG   ____________________________________________  RADIOLOGY   ____________________________________________   PROCEDURES  Procedure(s) performed: None  Procedures  Critical Care performed: No  ____________________________________________   INITIAL IMPRESSION / ASSESSMENT AND PLAN / ED COURSE  Pertinent labs & imaging results that were available during my care of the patient were reviewed by me and considered in my medical decision making (see chart for  details).  Viral exanthem. Patient given discharge care instructions. Patient given prescription for Medrol Dosepak, Atarax, tramadol. Patient advised follow-up with family doctor this condition persists.  Clinical Course      ____________________________________________   FINAL CLINICAL IMPRESSION(S) / ED DIAGNOSES  Final diagnoses:  Viral exanthem      NEW MEDICATIONS STARTED DURING THIS VISIT:  New Prescriptions   HYDROXYZINE (ATARAX) 10 MG/5ML SYRUP    Take 5 mLs (10 mg total) by mouth 3 (three) times daily as needed for itching.   METHYLPREDNISOLONE (MEDROL DOSEPAK) 4 MG TBPK TABLET    Take Tapered dose as directed   TRAMADOL (ULTRAM) 50 MG TABLET    Take 1 tablet (50 mg total) by mouth every 6 (six) hours as needed.     Note:  This document was prepared using Dragon voice recognition software and may include unintentional dictation errors.    Joni Reiningonald K Jolene Guyett, PA-C 10/22/16 [id number]    Jeanmarie PlantJames A McShane, MD 10/24/16 [phone]

## 2016-10-22 NOTE — Discharge Instructions (Signed)
Take medications as directed

## 2016-10-24 ENCOUNTER — Emergency Department
Admission: EM | Admit: 2016-10-24 | Discharge: 2016-10-24 | Disposition: A | Discharge status: Home / Self Care | Payer: Managed Care, Other (non HMO) | Attending: Emergency Medicine | Admitting: Emergency Medicine

## 2016-10-24 DIAGNOSIS — J45909 Unspecified asthma, uncomplicated: Secondary | ICD-10-CM | POA: Insufficient documentation

## 2016-10-24 DIAGNOSIS — Z79899 Other long term (current) drug therapy: Secondary | ICD-10-CM | POA: Insufficient documentation

## 2016-10-24 DIAGNOSIS — R21 Rash and other nonspecific skin eruption: Secondary | ICD-10-CM | POA: Diagnosis present

## 2016-10-24 DIAGNOSIS — Z5320 Procedure and treatment not carried out because of patient's decision for unspecified reasons: Secondary | ICD-10-CM | POA: Diagnosis not present

## 2016-10-24 DIAGNOSIS — F17210 Nicotine dependence, cigarettes, uncomplicated: Secondary | ICD-10-CM | POA: Diagnosis not present

## 2016-10-24 DIAGNOSIS — Z5329 Procedure and treatment not carried out because of patient's decision for other reasons: Secondary | ICD-10-CM

## 2016-10-24 NOTE — ED Notes (Signed)
Pt hit the call bell and when RN entered room and asked "what can I help you with" pt stated, "I already told them what I needed." RN stated "I did not hear what you said can you tell me again." Pt stated she needed the hospital supervisor, patient relations and whoever was over the Emergency Department. Pt was visibly upset at this point and RN attempted to ask pt what she could help her with. PT stated she did not need RN's help. RN verbalized, "I apologize but at this point I am one of the people that will be able to help you the most but I need you to tell me what is making you upset." pt then stated, " You do not even know what is really going on here. I was misdiagnosed and I never got the paperwork. I need to see my medical charts to prove that I was told the wrong thing. I do not need medical help I need to talk to the hospital management and I need all the paperwork so I have it when I sue."

## 2016-10-24 NOTE — ED Provider Notes (Signed)
Patient repeatedly calling from her room to front desk. Apparently also called the police. She is being rude and aggressive towards nursing staff. Went to see her because she apparently repeatedly requested me. I asked to see her rash so that we could properly care for her. She refused. I explained that without us showing her the rash there was nothing that we could do for her and informed her that she would be discharged. She then notified me that she would be contacting her lawyer. Her vitals are stable and she is overall well appearing. She has decisional capacity.   She has now refused to show the rash which is making her so upset to two physicians and a PA. There is nothing more we can reasonably do for her.    Joy Everyobert [name], MD 10/24/16 [phone]

## 2016-10-24 NOTE — ED Notes (Addendum)
RN at bedside to discharge pt. RN went through paperwork and attempted to discharge pt. Pt verbalized multiple concerns about last visit to ED and treatment plan. RN attempted to talk pt thorough paperwork from today's visit and answer pts questions from last visit. Pt would not let RN finish sentences though and most questions went unanswered because pt continued to state, "You do not know what you are talking about this is why I did't want to see Ron." Pt verbalized that she wanted to see the doctors notes from every visit. RN informed pt of MyChart and highlighted access in d/c papers. RN also informed pt she could go to medical record and request medical records. Pt was informed of location of medical records and informed she would need to return on a weekday to sign for records. Pt was then escorted out by [employer]. Pt was cooperative at time of discharge and did not cause a scene leaving the department to this RN's knowledge.

## 2016-10-24 NOTE — ED Notes (Signed)
Dr. Alphonzo LemmingsMcShane attempted to assess pt. RN unaware of exact words said in room but after MD left room RN entered to talk to pt and was told by pt that that was not her doctor and she needed to see Dr. Cyril LoosenKinner because that is who Selena BattenKim told her was going to assess her. Pt on phone at this time with unknown person asking to have patient relations sent to room 10. At this time Selena BattenKim entered the room to talk to patient. This RN left room after Selena BattenKim arrived. This RN spoke to [employer] nurse and Dr. Cyril LoosenKinner about the next actions to take.

## 2016-10-24 NOTE — ED Notes (Signed)
Dr. Cyril LoosenKinner and this RN entered pts room. Dr. Cyril LoosenKinner requested to see pts leg at which time the pt refused to show her rash because she had been requesting help and pt relations and they had not come. RN informed pt at this time that pt relations was not present at that time and if she wanted to talk to a Dr. This was the time to do so. Pt continued to refuse. Dr. Cyril LoosenKinner made it clear that if she refused two doctors assessments she would be discharged. Pt said "thank you" and continued to refuse care.

## 2016-10-24 NOTE — ED Triage Notes (Addendum)
Pt states that she was seen on Wednesday and diagnosed with shingles, pt states that she started with a rash on her rt inner thigh after shaving. Pt was given a shot for shingles when seen that day, pt states that her thigh is hurting worse and states that she doesn't think it is shingles. Pt is refusing to see the PA for treatment, pt states that "he doesn't know me, he doesn't know my body or how to treat me. Ron and I are not good for one another."

## 2016-10-24 NOTE — ED Notes (Addendum)
  RN Carollee HerterShannon and Rosanne SackKasey attempted to assess pt and pt verbalized "who are you" when both RN's entered the room. RN informed pt they were taking over care [facility] who had cared for pt previously. Pt verbalized at this time "I only want to talk to the Doctor." At the time in which pt stated this Dr. Fransisca ConnorsKinner's name was written on the white board. RN informed pt the doctor would be in to see her as soon as possible but that it was shift change and there may be a different doctor than who is on the board. Pt then requested Charge nurse. Rn left room and called charge RN and Database administratorHouse supervisor to inform both of situation.

## 2016-10-24 NOTE — ED Provider Notes (Addendum)
[facility] Emergency Department Provider Note  ____________________________________________   I have reviewed the triage vital signs and the nursing notes.   HISTORY  Chief Complaint Rash    HPI Joy Ramos is a 30 y.o. female who was seen here for a rash recently. Patient felt that she needs to be seen immediately for this rash in the acute care side of the emergency department. Apparently rash not yet gone away. She refused to see a PA today. She was told that coming to the acute care side with a level 4 complaint during a busy winter morning would possibly resulting in a longer wait as we are taking care of multiple critical patients. That we could see her immediately in flex and upgrade her as needed.  Nonetheless, she did insist upon coming to this area of the department which we are happy to accommodate.   When I went in to see the patient however she was on the telephone and she declined to get off the telephone initially and then she insisted that I wasn't really her doctor. I tried to explain to her many times that I was her doctor and I would be happy to help her any way I could and I wanted to look at her rash. She refused to show me her rash and said I needed to get on the computer in front of her to prove that I had seen her old records. I assured her that I had seen them and she again told me that I was not supposed to be her doctor.  She felt it was supposed to be someone else and 'how do you explain that then?"  I explained to her again  that I was her doctor, she said "why did they tell me it would be a different doctor then?"  I said I was unsure but she was in my room and I was happy to see her.  I asked her again to show me what she was here for so I could help her. She again refused.  I asked if she wanted a chaperone and she declined. I explained to her that I do unfortunately have many critical patients and I would either have to do an exam or  leave, she said  'why don't you just get out of here then?"     Past Medical History:  Diagnosis Date  . Asthma   . PE (pulmonary embolism)     There are no active problems to display for this patient.   Past Surgical History:  Procedure Laterality Date  . CESAREAN SECTION     x3  . TUBAL LIGATION      Prior to Admission medications   Medication Sig Start Date End Date Taking? Authorizing Provider  bifidobacterium infantis (ALIGN) capsule Take 1 capsule by mouth daily. 08/13/15   Jene Every, MD  cyclobenzaprine (FLEXERIL) 10 MG tablet Take 1 tablet (10 mg total) by mouth 3 (three) times daily as needed for muscle spasms. 01/14/16   Jami L Hagler, PA-C  cyclobenzaprine (FLEXERIL) 10 MG tablet Take 1 tablet (10 mg total) by mouth every 8 (eight) hours as needed for muscle spasms. 04/23/16   Joni Reining, PA-C  emtricitabine-tenofovir (TRUVADA) 200-300 MG tablet Take 1 tablet by mouth daily. 08/13/15   Jene Every, MD  fluconazole (DIFLUCAN) 150 MG tablet Take 1 tablet (150 mg total) by mouth daily. 08/13/15   Jene Every, MD  hydrocortisone (ANUSOL-HC) 2.5 % rectal cream Apply rectally  2 times daily 01/04/16 01/03/17  Charmayne Sheerharles M Beers, PA-C  hydrOXYzine (ATARAX) 10 MG/5ML syrup Take 5 mLs (10 mg total) by mouth 3 (three) times daily as needed for itching. 10/22/16   Joni Reiningonald K Smith, PA-C  ibuprofen (ADVIL,MOTRIN) 600 MG tablet Take 1 tablet (600 mg total) by mouth every 8 (eight) hours as needed. 04/23/16   Joni Reiningonald K Smith, PA-C  methylPREDNISolone (MEDROL DOSEPAK) 4 MG TBPK tablet Take Tapered dose as directed 10/22/16   Joni Reiningonald K Smith, PA-C  naproxen (NAPROSYN) 500 MG tablet Take 1 tablet (500 mg total) by mouth 2 (two) times daily with a meal. 01/14/16   Jami L Hagler, PA-C  oxyCODONE-acetaminophen (ROXICET) 5-325 MG tablet Take 1 tablet by mouth every 4 (four) hours as needed for severe pain. 04/23/16   Joni Reiningonald K Smith, PA-C  raltegravir (ISENTRESS) 400 MG tablet Take 1 tablet (400 mg  total) by mouth 2 (two) times daily. 08/13/15   Jene Everyobert Kinner, MD  traMADol (ULTRAM) 50 MG tablet Take 1 tablet (50 mg total) by mouth every 6 (six) hours as needed. 10/22/16 10/22/17  Joni Reiningonald K Smith, PA-C  Zinc Oxide 16 % OINT Apply 1 application topically as needed. 01/04/16   Evangeline Dakinharles M Beers, PA-C    Allergies Morphine and related; Penicillins; and Vicodin [hydrocodone-acetaminophen]  No family history on file.  Social History Social History  Substance Use Topics  . Smoking status: Current Every Day Smoker    Packs/day: 1.50    Types: Cigarettes  . Smokeless tobacco: Never Used  . Alcohol use Yes    Review of Systems Pt declined ____________________________________________   PHYSICAL EXAM:  VITAL SIGNS: ED Triage Vitals [10/24/16 0941]  Enc Vitals Group     BP 120/78     Pulse Rate 94     Resp 18     Temp 97.6 F (36.4 C)     Temp Source Oral     SpO2 100 %     Weight      Height      Head Circumference      Peak Flow      Pain Score 7     Pain Loc      Pain Edu?      Excl. in GC?     Constitutional: Alert and oriented. Well appearing and in no acute distress. Angry and walking about the room in nad.   exam: pt refused ____________________________________________   LABS (all labs ordered are listed, but only abnormal results are displayed)  Labs Reviewed - No data to display ____________________________________________  EKG  I personally interpreted any EKGs ordered by me or triage  ____________________________________________  RADIOLOGY  I reviewed any imaging ordered by me or triage that were performed during my shift and, if possible, patient and/or family made aware of any abnormal findings. ____________________________________________   PROCEDURES  Procedure(s) performed: None  Procedures  Critical Care performed: None  ____________________________________________   INITIAL IMPRESSION / ASSESSMENT AND PLAN / ED COURSE  Pertinent  labs & imaging results that were available during my care of the patient were reviewed by me and considered in my medical decision making (see chart for details).  Patient is apparently here for rash. She is very upset, for reasons I cannot fully understand. Her many times to do my best to address any medical issue that she has and any other issue that she has in the department. Patient is angrily denying me any opportunity to care for her at this time.  Obviously, I cannot care for someone if they refuse care. I have made multiple attempts to see if I can address whenever medical issue it is the brought her in today. She at this time is refusing. She understands that if she changes her mind I am at her disposal. She does not appear to be psychotic, however, and does not express si or hi.   ----------------------------------------- 12:06 PM on 10/24/2016 -----------------------------------------  Pt apparently calling the front desk and complaining.   ----------------------------------------- 12:16 PM on 10/24/2016 -----------------------------------------  Patient apparently calling the police on us for some reason. Unclear exactly why. As the patient was adamant that she be taken care of by Dr. Cyril LoosenKinner, Dr. Cyril LoosenKinner generously went and saw the patient. She then again refused care from him and told us that she was calling her lawyer. At this time there is no acute medical issue apparent and she is refusing treatment, preferring other activities that involve angry denunciations.  She does not appear to be an acute danger to self or others.  We will discharge. She does not appear to be altered or intoxicated and she does appear to have decision-making capacity per myself and Dr. Cyril LoosenKinner. At this point, pt autonomy suggests we can take it no further. She knows we will be happy to take care of her if she changes her mind or feels worse.   Clinical Course     ____________________________________________   FINAL CLINICAL IMPRESSION(S) / ED DIAGNOSES  Final diagnoses:  None      This chart was dictated using voice recognition software.  Despite best efforts to proofread,  errors can occur which can change meaning.      Jeanmarie PlantJames A Janie Strothman, MD 10/24/16 1206    Jeanmarie PlantJames A Arthurine Oleary, MD 10/24/16 [id number]    Jeanmarie PlantJames A Abou Sterkel, MD 10/24/16 [phone]    Jeanmarie PlantJames A Dillon Mcreynolds, MD 10/24/16 [phone]

## 2016-10-24 NOTE — Discharge Instructions (Signed)
You have refused to let us look at your rash or otherwise take care of you. Obviously this is your  choice however does limit our ability to evaluate you. If you change your mind or feel worse in any way you are  always welcome back in the emergency room where we will again be happy to help in any way that we can.

## 2016-11-27 ENCOUNTER — Emergency Department
Admission: EM | Admit: 2016-11-27 | Discharge: 2016-11-27 | Disposition: A | Discharge status: Home / Self Care | Payer: Managed Care, Other (non HMO) | Attending: Emergency Medicine | Admitting: Emergency Medicine

## 2016-11-27 ENCOUNTER — Encounter: Payer: Self-pay | Admitting: Emergency Medicine

## 2016-11-27 DIAGNOSIS — Z791 Long term (current) use of non-steroidal anti-inflammatories (NSAID): Secondary | ICD-10-CM | POA: Insufficient documentation

## 2016-11-27 DIAGNOSIS — Z5321 Procedure and treatment not carried out due to patient leaving prior to being seen by health care provider: Secondary | ICD-10-CM | POA: Insufficient documentation

## 2016-11-27 DIAGNOSIS — J45909 Unspecified asthma, uncomplicated: Secondary | ICD-10-CM | POA: Insufficient documentation

## 2016-11-27 DIAGNOSIS — L02414 Cutaneous abscess of left upper limb: Secondary | ICD-10-CM | POA: Insufficient documentation

## 2016-11-27 DIAGNOSIS — F17210 Nicotine dependence, cigarettes, uncomplicated: Secondary | ICD-10-CM | POA: Insufficient documentation

## 2016-11-27 NOTE — ED Triage Notes (Signed)
Called for room, no answer from lobby.

## 2016-11-27 NOTE — ED Triage Notes (Signed)
Pt reports abscess to left armpit x3 days.

## 2016-11-27 NOTE — ED Notes (Signed)
Called for room  No answer  

## 2017-02-06 ENCOUNTER — Emergency Department
Admission: EM | Admit: 2017-02-06 | Discharge: 2017-02-06 | Disposition: A | Discharge status: Home / Self Care | Payer: Managed Care, Other (non HMO) | Attending: Emergency Medicine | Admitting: Emergency Medicine

## 2017-02-06 DIAGNOSIS — F17210 Nicotine dependence, cigarettes, uncomplicated: Secondary | ICD-10-CM | POA: Insufficient documentation

## 2017-02-06 DIAGNOSIS — N760 Acute vaginitis: Secondary | ICD-10-CM | POA: Insufficient documentation

## 2017-02-06 DIAGNOSIS — J45909 Unspecified asthma, uncomplicated: Secondary | ICD-10-CM | POA: Insufficient documentation

## 2017-02-06 DIAGNOSIS — B9689 Other specified bacterial agents as the cause of diseases classified elsewhere: Secondary | ICD-10-CM

## 2017-02-06 DIAGNOSIS — Z79899 Other long term (current) drug therapy: Secondary | ICD-10-CM | POA: Insufficient documentation

## 2017-02-06 LAB — URINALYSIS, COMPLETE (UACMP) WITH MICROSCOPIC
Bilirubin Urine: NEGATIVE
GLUCOSE, UA: NEGATIVE mg/dL
HGB URINE DIPSTICK: NEGATIVE
KETONES UR: NEGATIVE mg/dL
Leukocytes, UA: NEGATIVE
Nitrite: NEGATIVE
PH: 6 (ref 5.0–8.0)
PROTEIN: NEGATIVE mg/dL
SPECIFIC GRAVITY, URINE: 1.017 (ref 1.005–1.030)

## 2017-02-06 LAB — CHLAMYDIA/NGC RT PCR (ARMC ONLY)
CHLAMYDIA TR: NOT DETECTED
N GONORRHOEAE: NOT DETECTED

## 2017-02-06 LAB — WET PREP, GENITAL
SPERM: NONE SEEN
TRICH WET PREP: NONE SEEN
WBC WET PREP: NONE SEEN
YEAST WET PREP: NONE SEEN

## 2017-02-06 MED ORDER — FLUCONAZOLE 150 MG PO TABS: 150.0000 mg | ORAL_TABLET | Freq: Once | ORAL | 0 refills | Status: AC

## 2017-02-06 MED ORDER — METRONIDAZOLE 0.75 % VA GEL: 1.0000 | Freq: Two times a day (BID) | VAGINAL | 0 refills | Status: AC

## 2017-02-06 NOTE — ED Notes (Signed)
Pt refused Discharge vital signs, pt refused Discharge information; pt states "I just want my prescription and I want to go"

## 2017-02-06 NOTE — ED Provider Notes (Signed)
[facility] Emergency Department Provider Note  ____________________________________________  Time seen: Approximately 5:32 PM  I have reviewed the triage vital signs and the nursing notes.   HISTORY  Chief Complaint Possible STD    HPI Joy Ramos is a 31 y.o. female who presents to emergency department complaining of vaginal pain with discharge. Patient reports that she was involved in a 3 some in the day afterwards she began to have some burning sensation in the vaginal region and has now experience copious clear discharge. Patient denies any odor. She denies any vaginal bleeding. She denies any dysuria, polyuria, hematuria. Patient reports that the other female in the 3 some is also experiencing same symptoms. She reports that intercourse was unprotected.  Patient has had a tubal ligation and adamantly refuses pregnancy test as "it will add cost to my bill."   Past Medical History:  Diagnosis Date  . Asthma   . PE (pulmonary embolism)     There are no active problems to display for this patient.   Past Surgical History:  Procedure Laterality Date  . CESAREAN SECTION     x3  . TUBAL LIGATION      Prior to Admission medications   Medication Sig Start Date End Date Taking? Authorizing Provider  bifidobacterium infantis (ALIGN) capsule Take 1 capsule by mouth daily. 08/13/15   Jene Every, MD  cyclobenzaprine (FLEXERIL) 10 MG tablet Take 1 tablet (10 mg total) by mouth 3 (three) times daily as needed for muscle spasms. 01/14/16   Jami L Hagler, PA-C  cyclobenzaprine (FLEXERIL) 10 MG tablet Take 1 tablet (10 mg total) by mouth every 8 (eight) hours as needed for muscle spasms. 04/23/16   Joni Reining, PA-C  emtricitabine-tenofovir (TRUVADA) 200-300 MG tablet Take 1 tablet by mouth daily. 08/13/15   Jene Every, MD  fluconazole (DIFLUCAN) 150 MG tablet Take 1 tablet (150 mg total) by mouth once. 02/06/17 02/06/17  Christiane Ha D Reola Buckles, PA-C   hydrOXYzine (ATARAX) 10 MG/5ML syrup Take 5 mLs (10 mg total) by mouth 3 (three) times daily as needed for itching. 10/22/16   Joni Reining, PA-C  ibuprofen (ADVIL,MOTRIN) 600 MG tablet Take 1 tablet (600 mg total) by mouth every 8 (eight) hours as needed. 04/23/16   Joni Reining, PA-C  methylPREDNISolone (MEDROL DOSEPAK) 4 MG TBPK tablet Take Tapered dose as directed 10/22/16   Joni Reining, PA-C  metroNIDAZOLE (METROGEL VAGINAL) 0.75 % vaginal gel Place 1 Applicatorful vaginally 2 (two) times daily. 02/06/17 02/11/17  Christiane Ha D Pennelope Basque, PA-C  naproxen (NAPROSYN) 500 MG tablet Take 1 tablet (500 mg total) by mouth 2 (two) times daily with a meal. 01/14/16   Jami L Hagler, PA-C  oxyCODONE-acetaminophen (ROXICET) 5-325 MG tablet Take 1 tablet by mouth every 4 (four) hours as needed for severe pain. 04/23/16   Joni Reining, PA-C  raltegravir (ISENTRESS) 400 MG tablet Take 1 tablet (400 mg total) by mouth 2 (two) times daily. 08/13/15   Jene Every, MD  traMADol (ULTRAM) 50 MG tablet Take 1 tablet (50 mg total) by mouth every 6 (six) hours as needed. 10/22/16 10/22/17  Joni Reining, PA-C  Zinc Oxide 16 % OINT Apply 1 application topically as needed. 01/04/16   Evangeline Dakin, PA-C    Allergies Morphine and related; Penicillins; and Vicodin [hydrocodone-acetaminophen]  No family history on file.  Social History Social History  Substance Use Topics  . Smoking status: Current Every Day Smoker    Packs/day: 1.50  Types: Cigarettes  . Smokeless tobacco: Never Used  . Alcohol use Yes     Review of Systems  Constitutional: No fever/chills Cardiovascular: no chest pain. Respiratory: no cough. No SOB. Gastrointestinal: No abdominal pain.  No nausea, no vomiting.  No diarrhea.  No constipation. Genitourinary: Negative for dysuria. No hematuria. Positive for vaginal irritation and discharge. Musculoskeletal: Negative for musculoskeletal pain. Skin: Negative for rash, abrasions,  lacerations, ecchymosis. Neurological: Negative for headaches, focal weakness or numbness. 10-point ROS otherwise negative.  ____________________________________________   PHYSICAL EXAM:  VITAL SIGNS: ED Triage Vitals  Enc Vitals Group     BP 02/06/17 1702 123/77     Pulse Rate 02/06/17 1702 (!) 108     Resp 02/06/17 1702 18     Temp 02/06/17 1702 98.8 F (37.1 C)     Temp Source 02/06/17 1702 Oral     SpO2 02/06/17 1702 100 %     Weight 02/06/17 1703 197 lb (89.4 kg)     Height 02/06/17 1703  (1.651 m)     Head Circumference --      Peak Flow --      Pain Score 02/06/17 1702 6     Pain Loc --      Pain Edu? --      Excl. in GC? --      Constitutional: Alert and oriented. Well appearing and in no acute distress. Eyes: Conjunctivae are normal. PERRL. EOMI. Head: Atraumatic. Neck: No stridor.    Cardiovascular: Normal rate, regular rhythm. Normal S1 and S2.  Good peripheral circulation. Respiratory: Normal respiratory effort without tachypnea or retractions. Lungs CTAB. Good air entry to the bases with no decreased or absent breath sounds. Gastrointestinal: Bowel sounds 4 quadrants. Soft and nontender to palpation. No guarding or rigidity. No palpable masses. No distention. No CVA tenderness. Genitourinary: No visible external lesions. No visible external drainage. Exam speculum reveals milky discharge with fishy odor in the vaginal vault. Cervix is unremarkable. Swabs are obtained. Bimanual exam reveals no cervical motion tenderness. No palpable abnormality to bilateral adnexa. Musculoskeletal: Full range of motion to all extremities. No gross deformities appreciated. Neurologic:  Normal speech and language. No gross focal neurologic deficits are appreciated.  Skin:  Skin is warm, dry and intact. No rash noted. Psychiatric: Mood and affect are normal. Speech and behavior are normal. Patient exhibits appropriate insight and judgement.  Pelvic exam is performed with  chaperone ____________________________________________   LABS (all labs ordered are listed, but only abnormal results are displayed)  Labs Reviewed  WET PREP, GENITAL - Abnormal; Notable for the following:       Result Value   Clue Cells Wet Prep HPF POC PRESENT (*)    All other components within normal limits  URINALYSIS, COMPLETE (UACMP) WITH MICROSCOPIC - Abnormal; Notable for the following:    Color, Urine YELLOW (*)    APPearance CLEAR (*)    Bacteria, UA RARE (*)    Squamous Epithelial / LPF 0-5 (*)    All other components within normal limits  CHLAMYDIA/NGC RT PCR ([facility] ONLY)   ____________________________________________  EKG   ____________________________________________  RADIOLOGY   No results found.  ____________________________________________    PROCEDURES  Procedure(s) performed:    Procedures    Medications - No data to display   ____________________________________________   INITIAL IMPRESSION / ASSESSMENT AND PLAN / ED COURSE  Pertinent labs & imaging results that were available during my care of the patient were reviewed by me and considered  in my medical decision making (see chart for details).  Review of the [place] CSRS was performed in accordance of the [facility] prior to dispensing any controlled drugs.     Patient's diagnosis is consistent with Bacterial vaginosis. Wet prep returned with clue cells. No other positive findings of gonorrhea, chlamydia, tract. Patient FOR MetroGel versus oral metronidazole. She request to come is a she routinely experiences yeast infection status post antibiotic use.Marland Kitchen She will follow up primary care as needed. Patient is given ED precautions to return to the ED for any worsening or new symptoms.     ____________________________________________  FINAL CLINICAL IMPRESSION(S) / ED DIAGNOSES  Final diagnoses:  BV (bacterial vaginosis)      NEW MEDICATIONS STARTED DURING THIS VISIT:  New  Prescriptions   FLUCONAZOLE (DIFLUCAN) 150 MG TABLET    Take 1 tablet (150 mg total) by mouth once.   METRONIDAZOLE (METROGEL VAGINAL) 0.75 % VAGINAL GEL    Place 1 Applicatorful vaginally 2 (two) times daily.        This chart was dictated using voice recognition software/Dragon. Despite best efforts to proofread, errors can occur which can change the meaning. Any change was purely unintentional.    Racheal Patches, PA-C 02/06/17 2004    Merrily Brittle, MD 02/06/17 [phone]

## 2017-02-06 NOTE — ED Triage Notes (Signed)
Pt reports having unprotected sex two days ago and since then she has had very watery vaginal discharge and low back pain. Pt reports a feeling of burning after she urinates and after she dries off after a shower.

## 2020-04-16 ENCOUNTER — Ambulatory Visit: Payer: Medicaid Other | Admitting: Obstetrics and Gynecology

## 2020-04-16 NOTE — Progress Notes (Deleted)
PCP:  Patient, No Pcp Per   No chief complaint on file.    HPI:      [name] is a 34 y.o. G3P0 whose LMP was No LMP recorded., presents today for her NP annual examination.  Her menses are {norm/abn:715}, lasting {number:22536} days.  Dysmenorrhea {dysmen:716}. She {does:18564} have intermenstrual bleeding.  Sex activity: {sex active:315163}.  Last Pap: {MWNU:272536644}  Results were: {norm/abn:16707::"no abnormalities"} /neg HPV DNA *** Hx of STDs: {STD hx:14358}  Last mammogram: {date:304500300}  Results were: {norm/abn:13465} There is no FH of breast cancer. There is no FH of ovarian cancer. The patient {does:18564} do self-breast exams.  Tobacco use: {tob:20664} Alcohol use: {Alcohol:11675} No drug use.  Exercise: {exercise:31265}  She {does:18564} get adequate calcium and Vitamin D in her diet.   Past Medical History:  Diagnosis Date  . Asthma   . PE (pulmonary embolism)     Past Surgical History:  Procedure Laterality Date  . CESAREAN SECTION     x3  . TUBAL LIGATION      No family history on file.  Social History   Socioeconomic History  . Marital status: Legally Separated    Spouse name: Not on file  . Number of children: Not on file  . Years of education: Not on file  . Highest education level: Not on file  Occupational History  . Not on file  Tobacco Use  . Smoking status: Current Every Day Smoker    Packs/day: 1.50    Types: Cigarettes  . Smokeless tobacco: Never Used  Substance and Sexual Activity  . Alcohol use: Yes  . Drug use: No  . Sexual activity: Not on file  Other Topics Concern  . Not on file  Social History Narrative  . Not on file   Social Determinants of Health   Financial Resource Strain:   . Difficulty of Paying Living Expenses:   Food Insecurity:   . Worried About Charity fundraiser in the Last Year:   . Arboriculturist in the Last Year:   Transportation Needs:   . Film/video editor (Medical):   Marland Kitchen  Lack of Transportation (Non-Medical):   Physical Activity:   . Days of Exercise per Week:   . Minutes of Exercise per Session:   Stress:   . Feeling of Stress :   Social Connections:   . Frequency of Communication with Friends and Family:   . Frequency of Social Gatherings with Friends and Family:   . Attends Religious Services:   . Active Member of Clubs or Organizations:   . Attends Archivist Meetings:   Marland Kitchen Marital Status:   Intimate Partner Violence:   . Fear of Current or Ex-Partner:   . Emotionally Abused:   Marland Kitchen Physically Abused:   . Sexually Abused:      Current Outpatient Medications:  .  bifidobacterium infantis (ALIGN) capsule, Take 1 capsule by mouth daily., Disp: 90 capsule, Rfl: 0 .  cyclobenzaprine (FLEXERIL) 10 MG tablet, Take 1 tablet (10 mg total) by mouth 3 (three) times daily as needed for muscle spasms., Disp: 21 tablet, Rfl: 0 .  cyclobenzaprine (FLEXERIL) 10 MG tablet, Take 1 tablet (10 mg total) by mouth every 8 (eight) hours as needed for muscle spasms., Disp: 15 tablet, Rfl: 0 .  emtricitabine-tenofovir (TRUVADA) 200-300 MG tablet, Take 1 tablet by mouth daily., Disp: 30 tablet, Rfl: 0 .  hydrOXYzine (ATARAX) 10 MG/5ML syrup, Take 5 mLs (10 mg total) by  mouth 3 (three) times daily as needed for itching., Disp: 240 mL, Rfl: 0 .  ibuprofen (ADVIL,MOTRIN) 600 MG tablet, Take 1 tablet (600 mg total) by mouth every 8 (eight) hours as needed., Disp: 15 tablet, Rfl: 0 .  methylPREDNISolone (MEDROL DOSEPAK) 4 MG TBPK tablet, Take Tapered dose as directed, Disp: 21 tablet, Rfl: 0 .  naproxen (NAPROSYN) 500 MG tablet, Take 1 tablet (500 mg total) by mouth 2 (two) times daily with a meal., Disp: 14 tablet, Rfl: 0 .  oxyCODONE-acetaminophen (ROXICET) 5-325 MG tablet, Take 1 tablet by mouth every 4 (four) hours as needed for severe pain., Disp: 30 tablet, Rfl: 0 .  raltegravir (ISENTRESS) 400 MG tablet, Take 1 tablet (400 mg total) by mouth 2 (two) times daily.,  Disp: 60 tablet, Rfl: 0 .  Zinc Oxide 16 % OINT, Apply 1 application topically as needed., Disp: 1 Tube, Rfl: 2     ROS:  Review of Systems BREAST: No symptoms   Objective: There were no vitals taken for this visit.   OBGyn Exam  Results: No results found for this or any previous visit (from the past 24 hour(s)).  Assessment/Plan: No diagnosis found.  No orders of the defined types were placed in this encounter.            GYN counsel {counseling:16159}     F/U  No follow-ups on file.  Lisette Mancebo B. Kelby Lotspeich, PA-C 04/16/2020 10:25 AM

## 2020-08-10 ENCOUNTER — Emergency Department
Admission: EM | Admit: 2020-08-10 | Discharge: 2020-08-10 | Discharge status: Against Medical Advice | Payer: Managed Care, Other (non HMO)

## 2020-12-17 ENCOUNTER — Encounter: Payer: Self-pay | Admitting: Internal Medicine

## 2020-12-24 ENCOUNTER — Emergency Department
Admission: EM | Admit: 2020-12-24 | Discharge: 2020-12-24 | Disposition: A | Discharge status: Home / Self Care | Payer: Medicaid Other | Attending: Emergency Medicine | Admitting: Emergency Medicine

## 2020-12-24 ENCOUNTER — Other Ambulatory Visit: Payer: Self-pay

## 2020-12-24 DIAGNOSIS — Z5321 Procedure and treatment not carried out due to patient leaving prior to being seen by health care provider: Secondary | ICD-10-CM | POA: Diagnosis not present

## 2020-12-24 DIAGNOSIS — R1032 Left lower quadrant pain: Secondary | ICD-10-CM | POA: Insufficient documentation

## 2020-12-24 DIAGNOSIS — D72829 Elevated white blood cell count, unspecified: Secondary | ICD-10-CM | POA: Diagnosis not present

## 2020-12-24 DIAGNOSIS — R197 Diarrhea, unspecified: Secondary | ICD-10-CM | POA: Insufficient documentation

## 2020-12-24 DIAGNOSIS — R1031 Right lower quadrant pain: Secondary | ICD-10-CM | POA: Insufficient documentation

## 2020-12-24 LAB — CBC
HCT: 45.1 % (ref 36.0–46.0)
Hemoglobin: 15.5 g/dL — ABNORMAL HIGH (ref 12.0–15.0)
MCH: 33.3 pg (ref 26.0–34.0)
MCHC: 34.4 g/dL (ref 30.0–36.0)
MCV: 97 fL (ref 80.0–100.0)
Platelets: 225 10*3/uL (ref 150–400)
RBC: 4.65 MIL/uL (ref 3.87–5.11)
RDW: 13.9 % (ref 11.5–15.5)
WBC: 17.3 10*3/uL — ABNORMAL HIGH (ref 4.0–10.5)
nRBC: 0 % (ref 0.0–0.2)

## 2020-12-24 LAB — LIPASE, BLOOD: Lipase: 32 U/L (ref 11–51)

## 2020-12-24 LAB — URINALYSIS, COMPLETE (UACMP) WITH MICROSCOPIC
Bacteria, UA: NONE SEEN
Bilirubin Urine: NEGATIVE
Glucose, UA: NEGATIVE mg/dL
Hgb urine dipstick: NEGATIVE
Ketones, ur: NEGATIVE mg/dL
Leukocytes,Ua: NEGATIVE
Nitrite: NEGATIVE
Protein, ur: NEGATIVE mg/dL
Specific Gravity, Urine: 1.01 (ref 1.005–1.030)
pH: 5 (ref 5.0–8.0)

## 2020-12-24 LAB — COMPREHENSIVE METABOLIC PANEL
ALT: 17 U/L (ref 0–44)
AST: 18 U/L (ref 15–41)
Albumin: 4 g/dL (ref 3.5–5.0)
Alkaline Phosphatase: 85 U/L (ref 38–126)
Anion gap: 9 (ref 5–15)
BUN: 9 mg/dL (ref 6–20)
CO2: 21 mmol/L — ABNORMAL LOW (ref 22–32)
Calcium: 9.3 mg/dL (ref 8.9–10.3)
Chloride: 108 mmol/L (ref 98–111)
Creatinine, Ser: 0.84 mg/dL (ref 0.44–1.00)
GFR, Estimated: >60 mL/min (ref >60)
Glucose, Bld: 104 mg/dL — ABNORMAL HIGH (ref 70–99)
Potassium: 3.6 mmol/L (ref 3.5–5.1)
Sodium: 138 mmol/L (ref 135–145)
Total Bilirubin: 0.8 mg/dL (ref 0.3–1.2)
Total Protein: 7.6 g/dL (ref 6.5–8.1)

## 2020-12-24 NOTE — ED Triage Notes (Addendum)
Pt comes pov with increased WBC, abdominal pain, Diarrhea. Pt took 14 day dose of clinda for teeth and has had diarrhea and belly pain since (this was in November). Pt had labs, vaginal swab, and urine done at PCP. Called to come here for white count.

## 2020-12-24 NOTE — ED Notes (Signed)
Pt wants to LWBS, advised of risks and to return with worsening symptoms

## 2020-12-24 NOTE — ED Notes (Signed)
Pt upset, pacing in lobby. States she wants to leave if she doesn't get a CT abdominal scan. Asked to provide a stool sample if she can, but pt is refusing. Per Shawnie Dapper, MD, she can have dose of Bentyl in meantime of lobby wait, but a CT will not be ordered at this time until Dr. Luberta Robertson her

## 2021-01-09 ENCOUNTER — Other Ambulatory Visit: Payer: Self-pay

## 2021-01-09 ENCOUNTER — Ambulatory Visit: Payer: Medicaid Other | Admitting: Internal Medicine

## 2021-01-09 ENCOUNTER — Encounter: Payer: Self-pay | Admitting: Internal Medicine

## 2021-01-09 VITALS — BP 115/72 | HR 79 | Temp 97.5°F | Ht 65.0 in | Wt 214.0 lb

## 2021-01-09 DIAGNOSIS — R1013 Epigastric pain: Secondary | ICD-10-CM | POA: Diagnosis not present

## 2021-01-09 DIAGNOSIS — G8929 Other chronic pain: Secondary | ICD-10-CM

## 2021-01-09 DIAGNOSIS — R110 Nausea: Secondary | ICD-10-CM | POA: Diagnosis not present

## 2021-01-09 DIAGNOSIS — R197 Diarrhea, unspecified: Secondary | ICD-10-CM

## 2021-01-09 DIAGNOSIS — R140 Abdominal distension (gaseous): Secondary | ICD-10-CM

## 2021-01-09 DIAGNOSIS — Z79899 Other long term (current) drug therapy: Secondary | ICD-10-CM

## 2021-01-09 NOTE — Patient Instructions (Signed)
I am going to check stool studies to rule out infectious etiologies of diarrhea.  I am also going to check your thyroid as well as celiac panel.  I will recheck your white blood cell count as well.  If all this is negative, we will proceed with EGD and colonoscopy to further evaluate your symptoms.  I will be in touch when I get these results back.  The stool studies can take up to a week to come back.  At [facility] we value your feedback. You may receive a survey about your visit today. Please share your experience as we strive to create trusting relationships with our patients to provide genuine, compassionate, quality care.  We appreciate your understanding and patience as we review any laboratory studies, imaging, and other diagnostic tests that are ordered as we care for you. Our office policy is 5 business days for review of these results, and any emergent or urgent results are addressed in a timely manner for your best interest. If you do not hear from our office in 1 week, please contact us.   We also encourage the use of MyChart, which contains your medical information for your review as well. If you are not enrolled in this feature, an access code is on this after visit summary for your convenience. Thank you for allowing [place] to be involved in your care.  It was great to see you today!  I hope you have a great rest of your spring!!    Hennie Duos. Marletta Lor, D.O. Gastroenterology and Hepatology [facility]

## 2021-01-09 NOTE — Progress Notes (Signed)
Primary Care Physician:  [facility] Primary Gastroenterologist:  Dr. Marletta Lor  Chief Complaint  Patient presents with  . Diarrhea    5-6 times per day, had bloody stool back in January  . Abdominal Pain    Upper abd, comes/goes, gets full fast with eating, lots of bloating with eating    HPI:   Joy Ramos is a 35 y.o. female who presents to the clinic today by referral from her PCP Coral Ceo for evaluation.  Patient states that she had a 2-week course of clindamycin back in November after having multiple teeth removed.  She states since that time she has had chronic diarrhea.  Notes multiple loose stools daily.  Has already had 2 bowel movements this a.m.  Unclear as far as what testing has been done.  She was supposed to have stool studies done but she does not think this was ever officially ran.  She was sent to the ER after routine blood work showed elevated white count of 17,000.  Also notes epigastric pain, nonradiating.  Reports nausea without vomiting as well this occurs on a daily basis.  Also reports sweating and feeling of always being hot even in cold rooms.  Past Medical History:  Diagnosis Date  . Asthma   . PE (pulmonary embolism)     Past Surgical History:  Procedure Laterality Date  . CESAREAN SECTION     x3  . TUBAL LIGATION      No current outpatient medications on file.   No current facility-administered medications for this visit.    Allergies as of 01/09/2021 - Review Complete 01/09/2021  Allergen Reaction Noted  . Morphine and related Swelling 05/08/2015  . Penicillins Swelling 10/22/2016  . Vicodin [hydrocodone-acetaminophen]  01/14/2016    Family History  Problem Relation Age of Onset  . Pulmonary embolism Maternal Grandmother   . Colon polyps Maternal Grandmother   . Colon cancer Paternal Grandfather     Social History   Socioeconomic History  . Marital status: Legally Separated    Spouse name: Not on  file  . Number of children: Not on file  . Years of education: Not on file  . Highest education level: Not on file  Occupational History  . Not on file  Tobacco Use  . Smoking status: Current Every Day Smoker    Packs/day: 1.50    Types: Cigarettes  . Smokeless tobacco: Never Used  Substance and Sexual Activity  . Alcohol use: Yes    Comment: 3 drinks per day  . Drug use: Yes    Types: Marijuana  . Sexual activity: Not on file  Other Topics Concern  . Not on file  Social History Narrative  . Not on file   Social Determinants of Health   Financial Resource Strain: Not on file  Food Insecurity: Not on file  Transportation Needs: Not on file  Physical Activity: Not on file  Stress: Not on file  Social Connections: Not on file  Intimate Partner Violence: Not on file    Subjective: Review of Systems  Constitutional: Negative for chills and fever.  HENT: Negative for congestion and hearing loss.   Eyes: Negative for blurred vision and double vision.  Respiratory: Negative for cough and shortness of breath.   Cardiovascular: Negative for chest pain and palpitations.  Gastrointestinal: Positive for abdominal pain, diarrhea and nausea. Negative for blood in stool, constipation, heartburn, melena and vomiting.  Genitourinary: Negative for dysuria and urgency.  Musculoskeletal: Negative for joint pain and myalgias.  Skin: Negative for itching and rash.  Neurological: Negative for dizziness and headaches.  Psychiatric/Behavioral: Negative for depression. The patient is not nervous/anxious.        Objective: BP 115/72   Pulse 79   Temp (!) 97.5 F (36.4 C)   Ht 5\' 5"  (1.651 m)   Wt 214 lb (97.1 kg)   LMP 01/05/2021   BMI 35.61 kg/m  Physical Exam Constitutional:      Appearance: Normal appearance.  HENT:     Head: Normocephalic and atraumatic.  Eyes:     Extraocular Movements: Extraocular movements intact.     Conjunctiva/sclera: Conjunctivae normal.   Cardiovascular:     Rate and Rhythm: Normal rate and regular rhythm.  Pulmonary:     Effort: Pulmonary effort is normal.     Breath sounds: Normal breath sounds.  Abdominal:     General: Bowel sounds are normal.     Palpations: Abdomen is soft.  Musculoskeletal:        General: No swelling. Normal range of motion.     Cervical back: Normal range of motion and neck supple.  Skin:    General: Skin is warm and dry.     Coloration: Skin is not jaundiced.  Neurological:     General: No focal deficit present.     Mental Status: She is alert and oriented to person, place, and time.  Psychiatric:        Mood and Affect: Mood normal.        Behavior: Behavior normal.      Assessment: *Nausea *Epigastric pain *Diarrhea *Abdominal bloating  Plan: Etiology of patient's diarrhea unclear.  Certainly given that this always preceded by 14-day course of clindamycin I am concerned about underlying C. difficile.  We will order stool studies to rule out infectious causes.  I will also check TSH as well as celiac panel.  If this work-up is negative we will likely proceed with both EGD and colonoscopy.  EGD to evaluate for peptic ulcer disease, esophagitis, gastritis, H. Pylori, duodenitis, or other. Will also evaluate for esophageal stricture, Schatzki's ring, esophageal web or other.   Colonoscopy to evaluate for underlying inflammatory bowel disease such as Crohn's disease, ulcerative colitis.  Also evaluate for microscopic colitis, polyps, malignancy, or other.  The risks including infection, bleed, or perforation as well as benefits, limitations, alternatives and imponderables have been reviewed with the patient. Potential for esophageal dilation, biopsy, etc. have also been reviewed.  Questions have been answered. All parties agreeable.  Consider right upper quadrant ultrasound for biliary colic if all the above is unremarkable.  Thank you 01/07/2021 for the kind referral.   01/09/2021  4:20 PM   Disclaimer: This note was dictated with voice recognition software. Similar sounding words can inadvertently be transcribed and may not be corrected upon review.

## 2021-01-10 LAB — CBC
HCT: 42 % (ref 35.0–45.0)
Hemoglobin: 14.6 g/dL (ref 11.7–15.5)
MCH: 34 pg — ABNORMAL HIGH (ref 27.0–33.0)
MCHC: 34.8 g/dL (ref 32.0–36.0)
MCV: 97.7 fL (ref 80.0–100.0)
MPV: 11.2 fL (ref 7.5–12.5)
Platelets: 227 10*3/uL (ref 140–400)
RBC: 4.3 10*6/uL (ref 3.80–5.10)
RDW: 13 % (ref 11.0–15.0)
WBC: 13 10*3/uL — ABNORMAL HIGH (ref 3.8–10.8)

## 2021-01-10 LAB — CELIAC DISEASE PANEL
(tTG) Ab, IgA: <1 U/mL
(tTG) Ab, IgG: <1 U/mL
Gliadin IgA: 8.2 U/mL
Gliadin IgG: 7.2 U/mL
Immunoglobulin A: 188 mg/dL (ref 47–310)

## 2021-01-10 LAB — TSH+FREE T4: TSH W/REFLEX TO FT4: 1.11 mIU/L

## 2021-01-22 LAB — C. DIFFICILE GDH AND TOXIN A/B: GDH ANTIGEN: NOT DETECTED

## 2021-01-24 LAB — C. DIFFICILE GDH AND TOXIN A/B
MICRO NUMBER:: 11650513
SPECIMEN QUALITY:: ADEQUATE
TOXIN A AND B: NOT DETECTED

## 2021-01-24 LAB — GASTROINTESTINAL PATHOGEN PANEL PCR
C. difficile Tox A/B, PCR: NOT DETECTED
Campylobacter, PCR: NOT DETECTED
Cryptosporidium, PCR: NOT DETECTED
E coli (ETEC) LT/ST PCR: NOT DETECTED
E coli (STEC) stx1/stx2, PCR: NOT DETECTED
E coli 0157, PCR: NOT DETECTED
Giardia lamblia, PCR: NOT DETECTED
Norovirus, PCR: NOT DETECTED
Rotavirus A, PCR: NOT DETECTED
Salmonella, PCR: NOT DETECTED
Shigella, PCR: NOT DETECTED

## 2021-02-03 ENCOUNTER — Other Ambulatory Visit: Payer: Self-pay | Admitting: *Deleted

## 2021-02-03 ENCOUNTER — Encounter: Payer: Self-pay | Admitting: *Deleted

## 2021-02-03 MED ORDER — PEG 3350-KCL-NA BICARB-NACL 420 G PO SOLR: ORAL | 0 refills | Status: AC

## 2021-02-07 ENCOUNTER — Other Ambulatory Visit: Payer: Self-pay

## 2021-02-07 ENCOUNTER — Other Ambulatory Visit (HOSPITAL_COMMUNITY)
Admission: RE | Admit: 2021-02-07 | Discharge: 2021-02-07 | Disposition: A | Discharge status: Home / Self Care | Payer: Medicaid Other | Source: Ambulatory Visit | Attending: Internal Medicine | Admitting: Internal Medicine

## 2021-02-07 DIAGNOSIS — Z01812 Encounter for preprocedural laboratory examination: Secondary | ICD-10-CM | POA: Diagnosis present

## 2021-02-07 DIAGNOSIS — Z20822 Contact with and (suspected) exposure to covid-19: Secondary | ICD-10-CM | POA: Insufficient documentation

## 2021-02-07 LAB — PREGNANCY, URINE: Preg Test, Ur: NEGATIVE

## 2021-02-07 LAB — SARS CORONAVIRUS 2 (TAT 6-24 HRS): SARS Coronavirus 2: NEGATIVE

## 2021-02-10 ENCOUNTER — Ambulatory Visit (HOSPITAL_COMMUNITY): Admission: RE | Admit: 2021-02-10 | Payer: Medicaid Other | Source: Home / Self Care

## 2021-02-10 ENCOUNTER — Other Ambulatory Visit: Payer: Self-pay

## 2021-02-10 ENCOUNTER — Encounter (HOSPITAL_COMMUNITY): Admission: RE | Payer: Self-pay | Source: Home / Self Care

## 2021-02-10 ENCOUNTER — Telehealth: Payer: Self-pay

## 2021-02-10 SURGERY — COLONOSCOPY WITH PROPOFOL
Anesthesia: Monitor Anesthesia Care

## 2021-02-10 NOTE — Telephone Encounter (Signed)
Joy Ramos at [facility] endo called office, pt cancelled TCS/EGD for today because she had ate.  Called pt, she picked up prep from pharmacy at 2:00pm Saturday and had just ate. She was afraid to proceed with prep since she was suppose to start clear liquids after lunch Saturday. TCS/EGD rescheduled to 03/03/21--PM. COVID test 02/28/21 at 1:30pm. Appt letter mailed with new procedure instructions. Endo scheduler informed.

## 2021-02-10 NOTE — OR Nursing (Signed)
Patient did not show up for procedure. Called patient she stated she ate on Saturday and was not coming. Notified Darlina Rumpf at the office of the above.

## 2021-02-27 ENCOUNTER — Telehealth: Payer: Self-pay | Admitting: *Deleted

## 2021-02-27 ENCOUNTER — Telehealth: Payer: Self-pay | Admitting: Internal Medicine

## 2021-02-27 NOTE — Telephone Encounter (Signed)
Received call from endo unable to reach patient to give arrival time for Monday's procedure. She needs to arrive at 11:00am.  Called pt. 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 I advised pt if she eats solid foods she would be cancelled. She stated "Well I'm telling you I am going to eat and I don't know who you need to speak to about that and you need to schedule me an office visit to come in for my stomach" I advised pt will transfer her to the front desk to schedule OV.   According to notes last time she was cancelled because she decided to eat and not follow directions. Melanie in endo stated patient was very rude to her as well. Procedure cancelled for Monday as she scheduled an OV with Misty Stanley. FYI to Dr. Marletta Lor.

## 2021-02-27 NOTE — Telephone Encounter (Signed)
PATIENT WANTS TO CANCEL HER PROCEDURE AND TALK TO DR CARVER PRIOR TO BEING SCHEDULED.  STATED SHE HAD NO IDEA THAT SHE HAD TO PREP FOR HER TEST AND NO ONE TOLD HER WHAT THEY WERE DOING TO HER

## 2021-02-28 ENCOUNTER — Other Ambulatory Visit (HOSPITAL_COMMUNITY)
Admission: RE | Admit: 2021-02-28 | Discharge: 2021-02-28 | Disposition: A | Discharge status: Home / Self Care | Payer: Medicaid Other | Source: Ambulatory Visit | Attending: Internal Medicine | Admitting: Internal Medicine

## 2021-03-01 ENCOUNTER — Other Ambulatory Visit: Payer: Self-pay

## 2021-03-01 ENCOUNTER — Emergency Department
Admission: EM | Admit: 2021-03-01 | Discharge: 2021-03-01 | Disposition: A | Discharge status: Home / Self Care | Payer: Medicaid Other | Attending: Emergency Medicine | Admitting: Emergency Medicine

## 2021-03-01 ENCOUNTER — Emergency Department: Payer: Medicaid Other

## 2021-03-01 ENCOUNTER — Encounter: Payer: Self-pay | Admitting: Emergency Medicine

## 2021-03-01 DIAGNOSIS — R059 Cough, unspecified: Secondary | ICD-10-CM | POA: Diagnosis present

## 2021-03-01 DIAGNOSIS — J4521 Mild intermittent asthma with (acute) exacerbation: Secondary | ICD-10-CM | POA: Diagnosis not present

## 2021-03-01 DIAGNOSIS — F17210 Nicotine dependence, cigarettes, uncomplicated: Secondary | ICD-10-CM | POA: Insufficient documentation

## 2021-03-01 MED ORDER — PREDNISONE 10 MG PO TABS: ORAL_TABLET | ORAL | 0 refills | Status: DC

## 2021-03-01 MED ORDER — IPRATROPIUM-ALBUTEROL 0.5-2.5 (3) MG/3ML IN SOLN
3.0000 mL | Freq: Once | RESPIRATORY_TRACT | Status: AC
  Administered 2021-03-01: 3 mL via RESPIRATORY_TRACT
  Filled 2021-03-01: qty 3

## 2021-03-01 MED ORDER — DEXAMETHASONE SODIUM PHOSPHATE 10 MG/ML IJ SOLN
10.0000 mg | Freq: Once | INTRAMUSCULAR | Status: AC
  Administered 2021-03-01: 10 mg via INTRAMUSCULAR
  Filled 2021-03-01: qty 1

## 2021-03-01 NOTE — ED Notes (Signed)
Pt states coughing and congestion for 5 days. States has tried multiple medications and stated she might have had a fever based on body aches and took some midol. States trying theraflu as well with no relief.

## 2021-03-01 NOTE — ED Provider Notes (Signed)
[facility] Emergency Department Provider Note   ____________________________________________   Event Date/Time   First MD Initiated Contact with Patient 03/01/21 [phone]     (approximate)  I have reviewed the triage vital signs and the nursing notes.   HISTORY  Chief Complaint Cough, Asthma, and Sinus Problem   HPI Joy Ramos is a 35 y.o. female presents to the ED with complaint of seasonal allergies along with exacerbation of her asthma.  Patient originally became worried about having COVID and is taken 3 COVID test in the last 48 hours.  She states that her last test was at [facility] 2 days ago and was negative.  Patient has been using her albuterol inhaler with little results.  She was instructed by [facility] to begin with Zyrtec and Flonase which has not helped with her symptoms.  Patient has been vaccinated twice with [employer].  She denies any change in taste or smell, nausea or vomiting, no diarrhea.  She continues to smoke 1 pack cigarettes per day.       Past Medical History:  Diagnosis Date  . Asthma   . PE (pulmonary embolism)     There are no problems to display for this patient.   Past Surgical History:  Procedure Laterality Date  . CESAREAN SECTION     x3  . TUBAL LIGATION      Prior to Admission medications   Medication Sig Start Date End Date Taking? Authorizing Provider  predniSONE (DELTASONE) 10 MG tablet Take 6 tablets  today, on day 2 take 5 tablets, day 3 take 4 tablets, day 4 take 3 tablets, day 5 take  2 tablets and 1 tablet the last day 03/01/21  Yes Bridget Hartshorn L, PA-C  albuterol (VENTOLIN HFA) 108 (90 Base) MCG/ACT inhaler Inhale 1-2 puffs into the lungs every 6 (six) hours as needed for wheezing or shortness of breath.    [provider]  fexofenadine (ALLEGRA) 180 MG tablet Take 180 mg by mouth daily as needed for allergies or rhinitis.    [provider]  polyethylene  glycol-electrolytes (NULYTELY) 420 g solution As directed 02/03/21   Lanelle Bal, DO    Allergies Morphine and related, Penicillins, and Vicodin [hydrocodone-acetaminophen]  Family History  Problem Relation Age of Onset  . Pulmonary embolism Maternal Grandmother   . Colon polyps Maternal Grandmother   . Colon cancer Paternal Grandfather     Social History Social History   Tobacco Use  . Smoking status: Current Every Day Smoker    Packs/day: 1.50    Types: Cigarettes  . Smokeless tobacco: Never Used  Substance Use Topics  . Alcohol use: Yes    Comment: 3 drinks per day  . Drug use: Yes    Types: Marijuana    Review of Systems Constitutional: No fever/chills Eyes: No visual changes. ENT: No sore throat.  Positive nasal congestion. Cardiovascular: Denies chest pain. Respiratory: Denies shortness of breath.  Positive for wheezing. Gastrointestinal: No abdominal pain.  No nausea, no vomiting.  No diarrhea.   Genitourinary: Negative for dysuria. Musculoskeletal: Positive for muscle aches. Skin: Negative for rash. Neurological: Negative for headaches, focal weakness or numbness.  ____________________________________________   PHYSICAL EXAM:  VITAL SIGNS: ED Triage Vitals  Enc Vitals Group     BP      Pulse      Resp      Temp      Temp src      SpO2  Weight      Height      Head Circumference      Peak Flow      Pain Score      Pain Loc      Pain Edu?      Excl. in GC?     Constitutional: Alert and oriented. Well appearing and in no acute distress. Eyes: Conjunctivae are normal. PERRL. EOMI. Head: Atraumatic. Nose: No congestion/rhinnorhea. Mouth/Throat: Mucous membranes are moist.  Oropharynx non-erythematous. Neck: No stridor.   Cardiovascular: Normal rate, regular rhythm. Grossly normal heart sounds.  Good peripheral circulation. Respiratory: Normal respiratory effort.  No retractions. Lungs mild intermittent wheezing noted on  expiration. Gastrointestinal: Soft and nontender. No distention.  Musculoskeletal: No lower extremity tenderness nor edema.  No joint effusions. Neurologic:  Normal speech and language. No gross focal neurologic deficits are appreciated. No gait instability. Skin:  Skin is warm, dry and intact. No rash noted. Psychiatric: Mood and affect are normal. Speech and behavior are normal.  ____________________________________________   LABS (all labs ordered are listed, but only abnormal results are displayed)  Labs Reviewed - No data to display ____________________________________________ ____________________________________________  RADIOLOGY Beaulah Corin, personally viewed and evaluated these images (plain radiographs) as part of my medical decision making, as well as reviewing the written report by the radiologist.   Official radiology report(s): DG Chest 2 View  Result Date: 03/01/2021 CLINICAL DATA:  Cough and dyspnea for 5 days.  Diaphoresis. EXAM: CHEST - 2 VIEW COMPARISON:  01/26/2016 FINDINGS: The heart size and mediastinal contours are within normal limits. Both lungs are clear. The visualized skeletal structures are unremarkable. IMPRESSION: Negative.  No active cardiopulmonary disease. Electronically Signed   By: Danae Orleans M.D.   On: 03/01/2021 09:46    ____________________________________________   PROCEDURES  Procedure(s) performed (including Critical Care):  Procedures   ____________________________________________   INITIAL IMPRESSION / ASSESSMENT AND PLAN / ED COURSE  As part of my medical decision making, I reviewed the following data within the electronic MEDICAL RECORD NUMBER Notes from prior ED visits and [place] Controlled Substance Database  35 year old female presents to the ED with complaint of acute exacerbation of her asthma.  She was seen at [facility] 2 days ago at which time she was told to begin taking Zyrtec and using Flonase nasal spray  which she states is not helping with her asthma.  Patient has had 3 COVID test in the last 48 hours and all have been reported as negative.  Patient does have a history of asthma.  Presently she has a mild expiratory wheeze heard bilaterally.  Chest x-ray was reassuring and patient was given a DuoNeb nebulizer treatment and Decadron 10 mg IM.  Patient complained about the burning that the Decadron caused and became agitated.  Patient left without discharge papers and states that she knows what will be on the papers but still leaving.  She was made aware that she should follow-up with her PCP if any continued problems.  ____________________________________________   FINAL CLINICAL IMPRESSION(S) / ED DIAGNOSES  Final diagnoses:  Mild intermittent asthma with exacerbation     ED Discharge Orders         Ordered    predniSONE (DELTASONE) 10 MG tablet        03/01/21 1015          *Please note:  Joy Ramos was evaluated in Emergency Department on 03/01/2021 for the symptoms described in the history of present illness.  She was evaluated in the context of the global COVID-19 pandemic, which necessitated consideration that the patient might be at risk for infection with the SARS-CoV-2 virus that causes COVID-19. Institutional protocols and algorithms that pertain to the evaluation of patients at risk for COVID-19 are in a state of rapid change based on information released by regulatory bodies including the CDC and federal and state organizations. These policies and algorithms were followed during the patient's care in the ED.  Some ED evaluations and interventions may be delayed as a result of limited staffing during and the pandemic.*   Note:  This document was prepared using Dragon voice recognition software and may include unintentional dictation errors.    Tommi Rumps, PA-C 03/01/21 1022    Delton Prairie, MD 03/01/21 [phone]

## 2021-03-01 NOTE — ED Notes (Signed)
Pt leaves room upset about "getting shot up with a steroid" because "my arm hurts" and "that's not what you said you were going to give me". Pt informed that she needed to wait for her prescription and paperwork but pt refuses stating "my arm hurts i'm going home". Per PA, prescription already sent in but pt refuses vitals or to sign.

## 2021-03-01 NOTE — ED Triage Notes (Signed)
Pt reports upper resp sx's and allergy issues for about 5 days and last night her asthma started acting up

## 2021-03-01 NOTE — ED Notes (Signed)
Pt taken to xray 

## 2021-03-03 ENCOUNTER — Encounter (HOSPITAL_COMMUNITY): Admission: RE | Payer: Self-pay | Source: Home / Self Care

## 2021-03-03 ENCOUNTER — Ambulatory Visit (HOSPITAL_COMMUNITY): Admission: RE | Admit: 2021-03-03 | Payer: Medicaid Other | Source: Home / Self Care

## 2021-03-03 SURGERY — COLONOSCOPY WITH PROPOFOL
Anesthesia: Monitor Anesthesia Care

## 2021-04-04 ENCOUNTER — Emergency Department
Admission: EM | Admit: 2021-04-04 | Discharge: 2021-04-04 | Disposition: A | Discharge status: Home / Self Care | Payer: Medicaid Other | Attending: Emergency Medicine | Admitting: Emergency Medicine

## 2021-04-04 ENCOUNTER — Other Ambulatory Visit: Payer: Self-pay

## 2021-04-04 ENCOUNTER — Encounter: Payer: Self-pay | Admitting: Emergency Medicine

## 2021-04-04 DIAGNOSIS — M79651 Pain in right thigh: Secondary | ICD-10-CM | POA: Insufficient documentation

## 2021-04-04 DIAGNOSIS — Y92481 Parking lot as the place of occurrence of the external cause: Secondary | ICD-10-CM | POA: Insufficient documentation

## 2021-04-04 DIAGNOSIS — F17210 Nicotine dependence, cigarettes, uncomplicated: Secondary | ICD-10-CM | POA: Insufficient documentation

## 2021-04-04 DIAGNOSIS — J45909 Unspecified asthma, uncomplicated: Secondary | ICD-10-CM | POA: Insufficient documentation

## 2021-04-04 MED ORDER — TRAMADOL HCL 50 MG PO TABS: 50.0000 mg | ORAL_TABLET | Freq: Four times a day (QID) | ORAL | 0 refills | Status: AC | PRN
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
## 2021-04-12 ENCOUNTER — Emergency Department: Payer: Medicaid Other

## 2021-04-12 ENCOUNTER — Encounter: Payer: Self-pay | Admitting: Emergency Medicine

## 2021-04-12 ENCOUNTER — Other Ambulatory Visit: Payer: Self-pay

## 2021-04-12 ENCOUNTER — Emergency Department
Admission: EM | Admit: 2021-04-12 | Discharge: 2021-04-12 | Disposition: A | Discharge status: Home / Self Care | Payer: Medicaid Other | Attending: Emergency Medicine | Admitting: Emergency Medicine

## 2021-04-12 DIAGNOSIS — Z5321 Procedure and treatment not carried out due to patient leaving prior to being seen by health care provider: Secondary | ICD-10-CM | POA: Diagnosis not present

## 2021-04-12 DIAGNOSIS — N644 Mastodynia: Secondary | ICD-10-CM | POA: Diagnosis present

## 2021-04-12 NOTE — ED Notes (Signed)
Patient told registration that she didn't want to be registered and didn't want to be seen and that she was going to go to [facility]. Provider aware.

## 2021-04-12 NOTE — ED Triage Notes (Signed)
Patient spoke with Elnita Maxwell [facility] and described concerns r/t left breast.  Per Elnita Maxwell, patient with left breast pain and lump palpated.  Is concerned about what lump may be.  Pain is to breast and area where lump has been palpated.

## 2021-04-12 NOTE — ED Triage Notes (Signed)
Arrives from urgent care for evaluation of Chest Pain.  Patient states she has had CP x 1 week.  AAOx3.  Skin warm and dry.  NAD.    Patient refuses to provide any additional information to this RN due to a past experience with this patient because the patient states "speak to loudly to the patients".

## 2021-04-12 NOTE — ED Triage Notes (Signed)
Pt reports pain to right breast for several days. Pt reports went to UC and was advised to come to the ED

## 2021-04-16 ENCOUNTER — Ambulatory Visit: Payer: Medicaid Other | Admitting: Internal Medicine

## 2021-10-09 ENCOUNTER — Emergency Department
Admission: EM | Admit: 2021-10-09 | Discharge: 2021-10-09 | Disposition: A | Discharge status: Home / Self Care | Payer: Medicaid Other | Attending: Emergency Medicine | Admitting: Emergency Medicine

## 2021-10-09 ENCOUNTER — Other Ambulatory Visit: Payer: Self-pay

## 2021-10-09 ENCOUNTER — Encounter: Payer: Self-pay | Admitting: Emergency Medicine

## 2021-10-09 DIAGNOSIS — J45909 Unspecified asthma, uncomplicated: Secondary | ICD-10-CM | POA: Insufficient documentation

## 2021-10-09 DIAGNOSIS — Z20822 Contact with and (suspected) exposure to covid-19: Secondary | ICD-10-CM | POA: Insufficient documentation

## 2021-10-09 DIAGNOSIS — J101 Influenza due to other identified influenza virus with other respiratory manifestations: Secondary | ICD-10-CM | POA: Diagnosis not present

## 2021-10-09 DIAGNOSIS — F17210 Nicotine dependence, cigarettes, uncomplicated: Secondary | ICD-10-CM | POA: Diagnosis not present

## 2021-10-09 DIAGNOSIS — R509 Fever, unspecified: Secondary | ICD-10-CM | POA: Diagnosis present

## 2021-10-09 DIAGNOSIS — B349 Viral infection, unspecified: Secondary | ICD-10-CM

## 2021-10-09 LAB — RESP PANEL BY RT-PCR (FLU A&B, COVID) ARPGX2
Influenza A by PCR: POSITIVE — AB
Influenza B by PCR: NEGATIVE
SARS Coronavirus 2 by RT PCR: NEGATIVE

## 2021-10-09 MED ORDER — PREDNISONE 50 MG PO TABS: 50.0000 mg | ORAL_TABLET | Freq: Every day | ORAL | 0 refills | Status: DC

## 2021-10-09 NOTE — ED Triage Notes (Signed)
Pt is tearful in triage and has multiple medical complaints. Has had a fever and taking a lot of OTC, she had her grandma bring her here because [place] have any hospital in [place] county. She states that all she can drink is OJ. She states that she has asthma and having trouble breathing. She is able to speak in complete clear sentences. NAD.

## 2021-10-09 NOTE — ED Notes (Signed)
Pt was already discharged by provider.

## 2021-10-09 NOTE — ED Provider Notes (Signed)
[facility] Emergency Department Provider Note   ____________________________________________    I have reviewed the triage vital signs and the nursing notes.   HISTORY  Chief Complaint Fever     HPI Joy Ramos is a 35 y.o. female with history of asthma who presents with complaints of chills, body aches, fatigue which is been ongoing x3 days.  Patient reports that she thinks she may have the flu or COVID but it is exacerbating her asthma.  She thinks that she needs steroid prescription.  Past Medical History:  Diagnosis Date   Asthma    PE (pulmonary embolism)     There are no problems to display for this patient.   Past Surgical History:  Procedure Laterality Date   CESAREAN SECTION     x3   TUBAL LIGATION      Prior to Admission medications   Medication Sig Start Date End Date Taking? Authorizing Provider  predniSONE (DELTASONE) 50 MG tablet Take 1 tablet (50 mg total) by mouth daily with breakfast. 10/09/21  Yes Jene Every, MD  albuterol (VENTOLIN HFA) 108 (90 Base) MCG/ACT inhaler Inhale 1-2 puffs into the lungs every 6 (six) hours as needed for wheezing or shortness of breath.    [provider]  fexofenadine (ALLEGRA) 180 MG tablet Take 180 mg by mouth daily as needed for allergies or rhinitis.    [provider]  polyethylene glycol-electrolytes (NULYTELY) 420 g solution As directed 02/03/21   Lanelle Bal, DO  traMADol (ULTRAM) 50 MG tablet Take 1 tablet (50 mg total) by mouth every 6 (six) hours as needed. 04/04/21   Minna Antis, MD     Allergies Morphine and related, Penicillins, and Vicodin [hydrocodone-acetaminophen]  Family History  Problem Relation Age of Onset   Pulmonary embolism Maternal Grandmother    Colon polyps Maternal Grandmother    Colon cancer Paternal Grandfather     Social History Social History   Tobacco Use   Smoking status: Every Day    Packs/day: 1.50    Types:  Cigarettes   Smokeless tobacco: Never  Substance Use Topics   Alcohol use: Yes    Comment: 3 drinks per day   Drug use: Yes    Types: Marijuana    Review of Systems  Constitutional: As above  ENT: Congestion   Gastrointestinal: No abdominal pain.  No nausea, no vomiting.   Genitourinary: Negative for dysuria. Musculoskeletal: Myalgia Skin: Negative for rash. Neurological: Negative for headaches     ____________________________________________   PHYSICAL EXAM:  VITAL SIGNS: ED Triage Vitals  Enc Vitals Group     BP 10/09/21 0813 (!) 138/104     Pulse Rate 10/09/21 0813 (!) 102     Resp 10/09/21 0813 20     Temp 10/09/21 0813 99 F (37.2 C)     Temp src --      SpO2 10/09/21 0813 96 %     Weight 10/09/21 0814 97.5 kg (215 lb)     Height 10/09/21 0814 1.651 m (5\' 5" )     Head Circumference --      Peak Flow --      Pain Score 10/09/21 0813 5     Pain Loc --      Pain Edu? --      Excl. in GC? --      Constitutional: Alert and oriented.  Eyes: Conjunctivae are normal.  Head: Atraumatic. Nose: Positive congestion Mouth/Throat: Mucous membranes are moist.   Cardiovascular:  Normal rate, regular rhythm.  Respiratory: Normal respiratory effort.  No retractions.  Scattered wheezes  Musculoskeletal: No lower extremity tenderness nor edema.   Neurologic:  Normal speech and language. No gross focal neurologic deficits are appreciated.   Skin:  Skin is warm, dry and intact. No rash noted.   ____________________________________________   LABS (all labs ordered are listed, but only abnormal results are displayed)  Labs Reviewed  RESP PANEL BY RT-PCR (FLU A&B, COVID) [id number]   ____________________________________________  EKG   ____________________________________________  RADIOLOGY None ____________________________________________   PROCEDURES  Procedure(s) performed: No  Procedures   Critical Care performed:  No ____________________________________________   INITIAL IMPRESSION / ASSESSMENT AND PLAN / ED COURSE  Pertinent labs & imaging results that were available during my care of the patient were reviewed by me and considered in my medical decision making (see chart for details).   Patient presents with symptoms consistent with viral syndrome, likely influenza a, possibly COVID.  She is unwilling to stay for further treatment and has informed us that she is leaving, I have prescribed her prednisone, she knows she can return anytime.   ____________________________________________   FINAL CLINICAL IMPRESSION(S) / ED DIAGNOSES  Final diagnoses:  Viral syndrome      NEW MEDICATIONS STARTED DURING THIS VISIT:  New Prescriptions   PREDNISONE (DELTASONE) 50 MG TABLET    Take 1 tablet (50 mg total) by mouth daily with breakfast.     Note:  This document was prepared using Dragon voice recognition software and may include unintentional dictation errors.    Jene Every, MD 10/09/21 [phone]

## 2021-10-17 ENCOUNTER — Other Ambulatory Visit: Payer: Self-pay

## 2021-10-17 ENCOUNTER — Encounter: Payer: Self-pay | Admitting: Emergency Medicine

## 2021-10-17 ENCOUNTER — Emergency Department
Admission: EM | Admit: 2021-10-17 | Discharge: 2021-10-17 | Disposition: A | Discharge status: Home / Self Care | Payer: Medicaid Other | Attending: Emergency Medicine | Admitting: Emergency Medicine

## 2021-10-17 DIAGNOSIS — L02411 Cutaneous abscess of right axilla: Secondary | ICD-10-CM | POA: Diagnosis present

## 2021-10-17 DIAGNOSIS — Z7952 Long term (current) use of systemic steroids: Secondary | ICD-10-CM | POA: Insufficient documentation

## 2021-10-17 DIAGNOSIS — F17210 Nicotine dependence, cigarettes, uncomplicated: Secondary | ICD-10-CM | POA: Diagnosis not present

## 2021-10-17 DIAGNOSIS — J45909 Unspecified asthma, uncomplicated: Secondary | ICD-10-CM | POA: Insufficient documentation

## 2021-10-17 DIAGNOSIS — R52 Pain, unspecified: Secondary | ICD-10-CM

## 2021-10-17 NOTE — ED Notes (Signed)
Pt given hot compress per request.

## 2021-10-17 NOTE — ED Notes (Signed)
Pt presents today with R axillary abscess today. Pt endorsing that is appeared a few days ago and has nor resolved.

## 2021-10-17 NOTE — ED Triage Notes (Signed)
Pt to ED from home c/o right axillary abscess.  Denies drainage, states she has been using warm compresses and trying to squeeze it.  Denies fevers.  Hx of similar abscesses.

## 2021-10-17 NOTE — ED Notes (Addendum)
Pt openly hostile to nurses, registration, and PA who entered room. Pt seen recording ED personnel moving in area of room 47 and making comments on her phone. Provider stated that she was threatening in their encounter and did not wish to participate in the exam. Provider discharged patient AMA and called security to escort patient from room. This nurse approached patient for discharge and she was found recording security personnel asking why they were there. She stated that she just wanted her papers and she was not going back in the room. She left the ED escorted by security personnel. Unable to obtain discharge vitals or signature.

## 2021-10-17 NOTE — ED Provider Notes (Signed)
[facility] Emergency Department Provider Note  ____________________________________________  Time seen: Approximately 10:11 PM  I have reviewed the triage vital signs and the nursing notes.   HISTORY  Chief Complaint Abscess    HPI Joy Ramos is a 35 y.o. female who presented to the emergency department reportedly for an abscess.  However patient is extremely hostile at this time.  Patient has been waiting approximately 2 hours from the time of arrival to the time I entered the room.  Patient refuses to participate in the reason that she is here, immediately demands that she needs paperwork to be discharged and that "I am reporting you."  Patient was informed that this is the emergency department, things do take times, and that I had been occupied seeing patients who had been here even longer than the patient.  And for the patient that I was here to take care of her but patient refuses to participate in the reason that she is here or answer any questions.  She stated that she did not care, she had been on her call bell for 10 minutes and nobody had come in to take care of her.  Patient demanded that I print paperwork with my name on it as "I am going to come after you."       Past Medical History:  Diagnosis Date   Asthma    PE (pulmonary embolism)     There are no problems to display for this patient.   Past Surgical History:  Procedure Laterality Date   CESAREAN SECTION     x3   TUBAL LIGATION      Prior to Admission medications   Medication Sig Start Date End Date Taking? Authorizing Provider  albuterol (VENTOLIN HFA) 108 (90 Base) MCG/ACT inhaler Inhale 1-2 puffs into the lungs every 6 (six) hours as needed for wheezing or shortness of breath.    [provider]  fexofenadine (ALLEGRA) 180 MG tablet Take 180 mg by mouth daily as needed for allergies or rhinitis.    [provider]  polyethylene glycol-electrolytes (NULYTELY)  420 g solution As directed 02/03/21   Lanelle Bal, DO  predniSONE (DELTASONE) 50 MG tablet Take 1 tablet (50 mg total) by mouth daily with breakfast. 10/09/21   Jene Every, MD  traMADol (ULTRAM) 50 MG tablet Take 1 tablet (50 mg total) by mouth every 6 (six) hours as needed. 04/04/21   Minna Antis, MD    Allergies Morphine and related, Penicillins, and Vicodin [hydrocodone-acetaminophen]  Family History  Problem Relation Age of Onset   Pulmonary embolism Maternal Grandmother    Colon polyps Maternal Grandmother    Colon cancer Paternal Grandfather     Social History Social History   Tobacco Use   Smoking status: Every Day    Packs/day: 1.50    Types: Cigarettes   Smokeless tobacco: Never  Substance Use Topics   Alcohol use: Yes    Comment: 3 drinks per day   Drug use: Yes    Types: Marijuana     Review of Systems   Patient declined to provide any information   ____________________________________________   PHYSICAL EXAM:  VITAL SIGNS: ED Triage Vitals  Enc Vitals Group     BP 10/17/21 2017 112/75     Pulse Rate 10/17/21 2017 96     Resp 10/17/21 2017 16     Temp 10/17/21 2017 98.4 F (36.9 C)     Temp Source 10/17/21 2017 Oral  SpO2 10/17/21 2017 98 %     Weight 10/17/21 2015 220 lb (99.8 kg)     Height 10/17/21 2015 5\' 5"  (1.651 m)     Head Circumference --      Peak Flow --      Pain Score 10/17/21 2016 10     Pain Loc --      Pain Edu? --      Excl. in GC? --      Constitutional: Alert and oriented. Well appearing and in no acute distress. Eyes: Conjunctivae are normal. PERRL. EOMI. Head: Atraumatic. Neck: No stridor.    Cardiovascular: Normal rate, regular rhythm. Normal S1 and S2.  Good peripheral circulation. Respiratory: Normal respiratory effort without tachypnea or retractions. Lungs CTAB. Good air entry to the bases with no decreased or absent breath sounds. Musculoskeletal: Full range of motion to all extremities. No  gross deformities appreciated. Neurologic:  Normal speech and language. No gross focal neurologic deficits are appreciated.  Skin:  Skin is warm, dry and intact. No rash noted. Psychiatric: Mood and affect are normal. Speech and behavior are aggressive.     ____________________________________________   LABS (all labs ordered are listed, but only abnormal results are displayed)  Labs Reviewed - No data to display ____________________________________________  EKG   ____________________________________________  RADIOLOGY   No results found.  ____________________________________________    PROCEDURES  Procedure(s) performed:    Procedures    Medications - No data to display   ____________________________________________   INITIAL IMPRESSION / ASSESSMENT AND PLAN / ED COURSE  Pertinent labs & imaging results that were available during my care of the patient were reviewed by me and considered in my medical decision making (see chart for details).  Review of the [place] CSRS was performed in accordance of the [facility] prior to dispensing any controlled drugs.           Patient arrived to the emergency department reportedly for an abscess.  Patient had been here roughly 2 hours from the time of arrival to the time I enter the room to see the patient.  Patient was extremely hostile and refused to participate in providing any information in regards to why she was here.  She immediately demanded discharge and paperwork with my name on it "as I am reporting you.  "I informed the patient that we saw the sickest and longest waits first and that I was here to take care of her, however she immediately demanded discharge and would not participate as to why she was here.  At this time patient will be discharged AGAINST MEDICAL ADVICE as I have not evaluated the patient appropriately as she refuses to provide any information, or comply with any exam.  There is no apparent emergent  condition as patient is freely walking about the emergency department, has no increased work of breathing, and her vital signs were reassuring on arrival..  Patient continued to be hostile, continue to make threats until she was discharged..  Security was called to ensure that patient did not further escalate.  Patient is discharged at this time.   ____________________________________________  FINAL CLINICAL IMPRESSION(S) / ED DIAGNOSES  Final diagnoses:  Pain      NEW MEDICATIONS STARTED DURING THIS VISIT:  ED Discharge Orders     None           This chart was dictated using voice recognition software/Dragon. Despite best efforts to proofread, errors can occur which can change the meaning. Any change was  purely unintentional.    Racheal Patches, PA-C 10/17/21 2224    Phineas Semen, MD 10/17/21 2240

## 2022-07-03 ENCOUNTER — Emergency Department
Admission: EM | Admit: 2022-07-03 | Discharge: 2022-07-03 | Disposition: A | Discharge status: Home / Self Care | Payer: Medicaid Other | Attending: Emergency Medicine | Admitting: Emergency Medicine

## 2022-07-03 ENCOUNTER — Encounter: Payer: Self-pay | Admitting: Emergency Medicine

## 2022-07-03 ENCOUNTER — Other Ambulatory Visit: Payer: Self-pay

## 2022-07-03 DIAGNOSIS — N764 Abscess of vulva: Secondary | ICD-10-CM | POA: Diagnosis present

## 2022-07-03 DIAGNOSIS — L0291 Cutaneous abscess, unspecified: Secondary | ICD-10-CM

## 2022-07-03 MED ORDER — CLINDAMYCIN HCL 300 MG PO CAPS: 300.0000 mg | ORAL_CAPSULE | Freq: Three times a day (TID) | ORAL | 0 refills | Status: AC

## 2022-07-03 MED ORDER — LIDOCAINE-EPINEPHRINE 2 %-1:100000 IJ SOLN
20.0000 mL | Freq: Once | INTRAMUSCULAR | Status: AC
  Administered 2022-07-03: 20 mL via INTRADERMAL
  Filled 2022-07-03: qty 1

## 2022-07-03 MED ORDER — FLUCONAZOLE 150 MG PO TABS: 150.0000 mg | ORAL_TABLET | Freq: Every day | ORAL | 0 refills | Status: AC

## 2022-07-03 NOTE — ED Provider Notes (Signed)
[facility] Provider Note    Event Date/Time   First MD Initiated Contact with Patient 07/03/22 [phone]     (approximate)   History   Abscess   HPI  Joy Ramos is a 36 y.o. female with a past medical history of hidradenitis suppurativa who presents today for evaluation of abscess.  She reports that she shaves in her pelvic region and has developed an abscess to her upper pubic hair area.  She denies involvement of her labia or intravaginally.  She reports that it feels like a lot of pressure in that area.  It is not draining.  She also feels that she has developed a recurrence of her discomfort in her left axilla.  She is requesting antibiotics for this.     Physical Exam   Triage Vital Signs: ED Triage Vitals  Enc Vitals Group     BP 07/03/22 0848 129/67     Pulse Rate 07/03/22 0848 98     Resp 07/03/22 0848 16     Temp 07/03/22 0848 98.7 F (37.1 C)     Temp Source 07/03/22 0848 Oral     SpO2 07/03/22 0848 95 %     Weight 07/03/22 0849 225 lb (102.1 kg)     Height 07/03/22 0849 5\' 5"  (1.651 m)     Head Circumference --      Peak Flow --      Pain Score 07/03/22 0849 10     Pain Loc --      Pain Edu? --      Excl. in GC? --     Most recent vital signs: Vitals:   07/03/22 0848  BP: 129/67  Pulse: 98  Resp: 16  Temp: 98.7 F (37.1 C)  SpO2: 95%    Physical Exam Vitals and nursing note reviewed.  Constitutional:      General: Awake and alert. No acute distress.    Appearance: Normal appearance. The patient is obese.  HENT:     Head: Normocephalic and atraumatic.     Mouth: Mucous membranes are moist.  Eyes:     General: PERRL. Normal EOMs        Right eye: No discharge.        Left eye: No discharge.     Conjunctiva/sclera: Conjunctivae normal.  Cardiovascular:     Rate and Rhythm: Normal rate and regular rhythm.     Pulses: Normal pulses.     Heart sounds: Normal heart sounds Pulmonary:     Effort: Pulmonary effort is  normal. No respiratory distress.     Breath sounds: Normal breath sounds.  Abdominal:     Abdomen is soft. There is no abdominal tenderness. No rebound or guarding. No distention. Musculoskeletal:        General: No swelling. Normal range of motion.     Cervical back: Normal range of motion and neck supple.  Skin:    General: Skin is warm and dry.     Capillary Refill: Capillary refill takes less than 2 seconds.     Findings: No rash.  Upper portion of pubic hair area is shaved and there is a 2 x 2 centimeter area of erythema and fluctuance that is tender to palpation.  There is no involvement of the labia or Bartholin glands.  No drainage.  Open wounds. Left axilla with 1 x 1 cm indurated area without erythema.  No drainage.  Full and normal range of motion of shoulder. Neurological:  Mental Status: The patient is awake and alert.      ED Results / Procedures / Treatments   Labs (all labs ordered are listed, but only abnormal results are displayed) Labs Reviewed - No data to display   EKG     RADIOLOGY     PROCEDURES:  Critical Care performed:   Marland KitchenMarland KitchenIncision and Drainage  Date/Time: 07/03/2022 9:38 AM  Performed by: Jackelyn Hoehn, PA-C Authorized by: Jackelyn Hoehn, PA-C   Consent:    Consent obtained:  Verbal   Consent given by:  Patient   Risks, benefits, and alternatives were discussed: yes     Risks discussed:  Bleeding, damage to other organs, incomplete drainage, infection and pain   Alternatives discussed:  No treatment Universal protocol:    Procedure explained and questions answered to patient or proxy's satisfaction: yes     Relevant documents present and verified: yes     Test results available : yes     Imaging studies available: yes (Bedside ultrasound)     Required blood products, implants, devices, and special equipment available: yes     Site/side marked: yes     Immediately prior to procedure, a time out was called: yes     Patient identity  confirmed:  Verbally with patient Location:    Type:  Abscess   Location:  Anogenital   Anogenital location:  Vulva Pre-procedure details:    Skin preparation:  Povidone-iodine Sedation:    Sedation type:  None Anesthesia:    Anesthesia method:  Local infiltration   Local anesthetic:  Lidocaine 1% WITH epi Procedure type:    Complexity:  Complex Procedure details:    Ultrasound guidance: yes     Incision types:  Stab incision and single straight   Incision depth:  Dermal   Wound management:  Probed and deloculated and irrigated with saline   Drainage:  Purulent   Drainage amount:  Moderate   Wound treatment:  Wound left open (Patient declined packing)   Packing materials:  None Post-procedure details:    Procedure completion:  Tolerated well, no immediate complications Ultrasound ED Soft Tissue  Date/Time: 07/03/2022 9:39 AM  Performed by: Jackelyn Hoehn, PA-C Authorized by: Jackelyn Hoehn, PA-C   Procedure details:    Indications: localization of abscess and evaluate for cellulitis     Transverse view:  Visualized   Longitudinal view:  Visualized   Images: not archived   Location:    Location: axilla     Side:  Left Findings:     no abscess present    cellulitis present    no foreign body present Ultrasound ED Soft Tissue  Date/Time: 07/03/2022 9:40 AM  Performed by: Jackelyn Hoehn, PA-C Authorized by: Jackelyn Hoehn, PA-C   Procedure details:    Indications: localization of abscess and evaluate for cellulitis     Transverse view:  Visualized   Longitudinal view:  Visualized   Images: not archived     Limitations:  Body habitus Location:    Location: groin     Location comment:  Lower abdomen at the top of pubic hair area   Side:  Left Findings:     abscess present    no cellulitis present    no foreign body present    MEDICATIONS ORDERED IN ED: Medications  lidocaine-EPINEPHrine (XYLOCAINE W/EPI) 2 %-1:100000 (with pres) injection 20 mL (20 mLs  Intradermal Given 07/03/22 0929)     IMPRESSION / MDM / ASSESSMENT AND  PLAN / ED COURSE  I reviewed the triage vital signs and the nursing notes.   Differential diagnosis includes, but is not limited to, abscess, cyst, cellulitis.  Patient is awake and alert, hemodynamically stable and afebrile.  I performed a bedside ultrasound of both her vulvar area and her axilla.  Her axilla demonstrates only cobblestoning without focal drainable abscess.  However her vulvar region demonstrates a small fluid pocket.  Patient agreed to I&D.  Area was anesthetized, cleaned with iodine, and a single stab incision was made with moderate purulent output.  The pocket was irrigated.  She refused wick placement, understands the possibility of recurrence without the wick.  We discussed the importance of keeping the area clean and dry washing with soap and water.  She was also started on clindamycin given that she has a penicillin allergy.  She requested a dose of Diflucan to take at the end of her antibiotics given her proclivity of developing yeast infections.  We discussed strict return precautions and outpatient follow-up.  Patient understands and agrees with plan.  She was discharged in stable condition.  Patient's presentation is most consistent with severe exacerbation of chronic illness.      FINAL CLINICAL IMPRESSION(S) / ED DIAGNOSES   Final diagnoses:  Abscess     Rx / DC Orders   ED Discharge Orders          Ordered    clindamycin (CLEOCIN) 300 MG capsule  3 times daily        07/03/22 0932    fluconazole (DIFLUCAN) 150 MG tablet  Daily        07/03/22 0932             Note:  This document was prepared using Dragon voice recognition software and may include unintentional dictation errors.   Keturah Shavers 07/03/22 1005    Minna Antis, MD 07/03/22 [phone]

## 2022-07-03 NOTE — Discharge Instructions (Signed)
Keep the area clean and dry, wash multiple times per day with soap and water.  You may take the antibiotics as prescribed.  Take the Diflucan at the completion of the antibiotics.  Please return for any new, worsening, or change in symptoms or other concerns.  It was a pleasure caring for you today.

## 2022-07-03 NOTE — ED Notes (Signed)
See triage note  Presents with possible abscess area to right groin  States she noticed area 3 days ago  Has been using warm compresses with no relief   Also the abscess area under left arm is still draining   No fever

## 2022-07-03 NOTE — ED Triage Notes (Signed)
Pt to ED via POV c/o abscess in her groin area. Pt states that it has been there for the past 3 days. Pt states that the pain is getting worse. Pt states that she also has an abscess under her left arm that has been drained but it has not healed. Pt states that she thinks it is infected because her dog keeps coming up and smelling her arm. Pt is currently in NAD.

## 2022-07-09 ENCOUNTER — Emergency Department
Admission: EM | Admit: 2022-07-09 | Discharge: 2022-07-09 | Disposition: A | Discharge status: Home / Self Care | Payer: Medicaid Other | Attending: Emergency Medicine | Admitting: Emergency Medicine

## 2022-07-09 ENCOUNTER — Other Ambulatory Visit: Payer: Self-pay

## 2022-07-09 ENCOUNTER — Encounter: Payer: Self-pay | Admitting: Emergency Medicine

## 2022-07-09 DIAGNOSIS — Z5321 Procedure and treatment not carried out due to patient leaving prior to being seen by health care provider: Secondary | ICD-10-CM | POA: Diagnosis not present

## 2022-07-09 DIAGNOSIS — R519 Headache, unspecified: Secondary | ICD-10-CM | POA: Diagnosis not present

## 2022-07-09 NOTE — ED Triage Notes (Signed)
Patient to ED for headache since Friday. Patient states that that in the afternoon/evening time is when the headache has been coming on. Denies visual changes.

## 2022-07-09 NOTE — ED Notes (Signed)
Patient called x3 with no answer from lobby.
